# Patient Record
Sex: Female | Born: 1995 | Race: Black or African American | Hispanic: No | Marital: Single | Smoking: Former smoker
Health system: Southern US, Community
[De-identification: ages and names within clinical notes are randomized; demographics above are authoritative.]

## PROBLEM LIST (undated history)

## (undated) DIAGNOSIS — F419 Anxiety disorder, unspecified: Secondary | ICD-10-CM

## (undated) DIAGNOSIS — Z8719 Personal history of other diseases of the digestive system: Secondary | ICD-10-CM

## (undated) DIAGNOSIS — K529 Noninfective gastroenteritis and colitis, unspecified: Secondary | ICD-10-CM

## (undated) DIAGNOSIS — Z9889 Other specified postprocedural states: Secondary | ICD-10-CM

## (undated) HISTORY — PX: HERNIA REPAIR: SHX51

---
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
## 2016-04-22 ENCOUNTER — Emergency Department (HOSPITAL_BASED_OUTPATIENT_CLINIC_OR_DEPARTMENT_OTHER): Payer: Managed Care, Other (non HMO)

## 2016-04-22 ENCOUNTER — Encounter (HOSPITAL_BASED_OUTPATIENT_CLINIC_OR_DEPARTMENT_OTHER): Payer: Self-pay | Admitting: Emergency Medicine

## 2016-04-22 ENCOUNTER — Emergency Department (HOSPITAL_BASED_OUTPATIENT_CLINIC_OR_DEPARTMENT_OTHER)
Admission: EM | Admit: 2016-04-22 | Discharge: 2016-04-22 | Disposition: A | Discharge status: Home / Self Care | Payer: Managed Care, Other (non HMO) | Attending: Emergency Medicine | Admitting: Emergency Medicine

## 2016-04-22 DIAGNOSIS — R112 Nausea with vomiting, unspecified: Secondary | ICD-10-CM | POA: Diagnosis present

## 2016-04-22 DIAGNOSIS — R1013 Epigastric pain: Secondary | ICD-10-CM | POA: Diagnosis not present

## 2016-04-22 DIAGNOSIS — R197 Diarrhea, unspecified: Secondary | ICD-10-CM | POA: Diagnosis not present

## 2016-04-22 LAB — COMPREHENSIVE METABOLIC PANEL
ALBUMIN: 4.6 g/dL (ref 3.5–5.0)
ALT: 18 U/L (ref 14–54)
ANION GAP: 13 (ref 5–15)
AST: 34 U/L (ref 15–41)
Alkaline Phosphatase: 62 U/L (ref 38–126)
BILIRUBIN TOTAL: 1.3 mg/dL — AB (ref 0.3–1.2)
BUN: 7 mg/dL (ref 6–20)
CO2: 17 mmol/L — AB (ref 22–32)
Calcium: 10 mg/dL (ref 8.9–10.3)
Chloride: 108 mmol/L (ref 101–111)
Creatinine, Ser: 0.78 mg/dL (ref 0.44–1.00)
GFR calc Af Amer: >60 mL/min (ref >60)
GFR calc non Af Amer: >60 mL/min (ref >60)
GLUCOSE: 133 mg/dL — AB (ref 65–99)
Potassium: 3.2 mmol/L — ABNORMAL LOW (ref 3.5–5.1)
SODIUM: 138 mmol/L (ref 135–145)
TOTAL PROTEIN: 8.1 g/dL (ref 6.5–8.1)

## 2016-04-22 LAB — CBC WITH DIFFERENTIAL/PLATELET
BASOS ABS: 0 10*3/uL (ref 0.0–0.1)
BASOS PCT: 0 %
EOS ABS: 0 10*3/uL (ref 0.0–0.7)
Eosinophils Relative: 0 %
HEMATOCRIT: 43.5 % (ref 36.0–46.0)
Hemoglobin: 15.1 g/dL — ABNORMAL HIGH (ref 12.0–15.0)
Lymphocytes Relative: 16 %
Lymphs Abs: 1.5 10*3/uL (ref 0.7–4.0)
MCH: 29 pg (ref 26.0–34.0)
MCHC: 34.7 g/dL (ref 30.0–36.0)
MCV: 83.5 fL (ref 78.0–100.0)
Monocytes Absolute: 0.5 10*3/uL (ref 0.1–1.0)
Monocytes Relative: 6 %
NEUTROS PCT: 78 %
Neutro Abs: 7.4 10*3/uL (ref 1.7–7.7)
Platelets: 288 10*3/uL (ref 150–400)
RBC: 5.21 MIL/uL — ABNORMAL HIGH (ref 3.87–5.11)
RDW: 11.9 % (ref 11.5–15.5)
WBC: 9.5 10*3/uL (ref 4.0–10.5)

## 2016-04-22 LAB — URINALYSIS, ROUTINE W REFLEX MICROSCOPIC
Bilirubin Urine: NEGATIVE
GLUCOSE, UA: 250 mg/dL — AB
Hgb urine dipstick: NEGATIVE
LEUKOCYTES UA: NEGATIVE
NITRITE: NEGATIVE
PH: 8.5 — AB (ref 5.0–8.0)
Protein, ur: NEGATIVE mg/dL
SPECIFIC GRAVITY, URINE: 1.018 (ref 1.005–1.030)

## 2016-04-22 LAB — LIPASE, BLOOD: Lipase: 21 U/L (ref 11–51)

## 2016-04-22 LAB — PREGNANCY, URINE: Preg Test, Ur: NEGATIVE

## 2016-04-22 MED ORDER — SODIUM CHLORIDE 0.9 % IV BOLUS (SEPSIS)
1000.0000 mL | Freq: Once | INTRAVENOUS | Status: AC
  Administered 2016-04-22: 1000 mL via INTRAVENOUS

## 2016-04-22 MED ORDER — ONDANSETRON HCL 4 MG/2ML IJ SOLN
4.0000 mg | Freq: Once | INTRAMUSCULAR | Status: AC
  Administered 2016-04-22: 4 mg via INTRAVENOUS
  Filled 2016-04-22: qty 2

## 2016-04-22 MED ORDER — PROMETHAZINE HCL 25 MG PO TABS: 25.0000 mg | ORAL_TABLET | Freq: Four times a day (QID) | ORAL | 0 refills | Status: DC | PRN

## 2016-04-22 MED ORDER — HALOPERIDOL LACTATE 5 MG/ML IJ SOLN
2.0000 mg | Freq: Once | INTRAMUSCULAR | Status: AC
  Administered 2016-04-22: 2 mg via INTRAVENOUS
  Filled 2016-04-22: qty 1

## 2016-04-22 MED ORDER — PANTOPRAZOLE SODIUM 40 MG IV SOLR
40.0000 mg | Freq: Once | INTRAVENOUS | Status: AC
  Administered 2016-04-22: 40 mg via INTRAVENOUS
  Filled 2016-04-22: qty 40

## 2016-04-22 MED ORDER — DEXTROSE 5 % IV BOLUS
1000.0000 mL | Freq: Once | INTRAVENOUS | Status: AC
  Administered 2016-04-22: 1000 mL via INTRAVENOUS

## 2016-04-22 MED ORDER — LORAZEPAM 2 MG/ML IJ SOLN
1.0000 mg | Freq: Once | INTRAMUSCULAR | Status: AC
  Administered 2016-04-22: 1 mg via INTRAVENOUS
  Filled 2016-04-22: qty 1

## 2016-04-22 MED ORDER — PROMETHAZINE HCL 25 MG RE SUPP: 25.0000 mg | Freq: Four times a day (QID) | RECTAL | 0 refills | Status: DC | PRN

## 2016-04-22 MED ORDER — FENTANYL CITRATE (PF) 100 MCG/2ML IJ SOLN
50.0000 ug | Freq: Once | INTRAMUSCULAR | Status: AC
  Administered 2016-04-22: 50 ug via INTRAVENOUS
  Filled 2016-04-22: qty 2

## 2016-04-22 MED FILL — PROMETHAZINE 25 MG TABLET: 25 | 7 days supply | Qty: 30 | Fill #0

## 2016-04-22 MED FILL — PHENADOZ 25 MG SUPP: 25 | 3 days supply | Qty: 12 | Fill #0

## 2016-04-22 NOTE — ED Notes (Signed)
Tol PO fluids

## 2016-04-22 NOTE — ED Notes (Signed)
Capzasin-HP cream applied to abdomen per order Dr. Anitra LauthPlunkett.

## 2016-04-22 NOTE — ED Notes (Signed)
Family to nsg station stating pt is still c/o epigastric pain. Dr. Read DriversMolpus present and aware.

## 2016-04-22 NOTE — ED Provider Notes (Addendum)
Urine consistent with dehydration with greater than 80 ketones and mild hypokalemia suggestive of vomiting. X-ray without significant signs of constipation or obstruction. Patient feeling better and tolerating by mouth's. She was discharged home with referral to left lower GI as family requested. She was also given Phenergan to try at home with nausea and vomiting returned.   9:09 AM Right before d/c pt began vomiting and abd cramping again and given haldol.  Gwyneth SproutWhitney Bonnie Overdorf, MD 04/22/16 [id number]    Gwyneth SproutWhitney Ashiyah Pavlak, MD 04/22/16 [phone]

## 2016-04-22 NOTE — ED Triage Notes (Signed)
Pt with vomiting x several hours.

## 2016-04-22 NOTE — ED Triage Notes (Signed)
Pt hyperventilating, yelling out, and not cooperative.

## 2016-04-22 NOTE — ED Provider Notes (Signed)
[facility] Provider Note: Lowella Dell, MD, FACEP  CSN: [id number] MRN: [id number] ARRIVAL: 04/22/16 at 0601 ROOM: MHOTF/OTF   CHIEF COMPLAINT  Vomiting   HISTORY OF PRESENT ILLNESS  Lindsey Sherman is a 21 y.o. female who awoke about 4:30 this morning with severe epigastric pain, nausea and vomiting. She also has a sensation that she may have diarrhea but has not moved her bowels yet. She attempted to take ODT Zofran prior to arrival but vomited this back up. She arrives agitated and hyperventilating. Epigastric pain is worse with movement or palpation. She is having difficulty finding a comfortable position. It is not known if she has had a fever. She has a Mirena implant and has irregular periods.   Past Medical History:  Diagnosis Date  . Colitis   . H/O hernia repair 2008   done at [facility]    Past Surgical History:  Procedure Laterality Date  . h/o hernia repair  2008  . HERNIA REPAIR      Family History  Problem Relation Age of Onset  . Hypertension Mother   . Diabetes Maternal Grandmother   . Hypertension Maternal Grandmother   . Osteoarthritis Maternal Grandmother   . Heart failure Maternal Grandmother   . Stroke Maternal Grandmother     Social History  Substance Use Topics  . Smoking status: Never Smoker  . Smokeless tobacco: Never Used  . Alcohol use No    Prior to Admission medications   Medication Sig Start Date End Date Taking? Authorizing Provider  etonogestrel (NEXPLANON) 68 MG IMPL implant 1 each by Subdermal route once.   Yes Historical Provider, MD  ondansetron (ZOFRAN-ODT) 4 MG disintegrating tablet Take 4 mg by mouth every 8 (eight) hours as needed for nausea or vomiting.   Yes Historical Provider, MD  promethazine (PHENERGAN) 25 MG suppository Place 1 suppository (25 mg total) rectally every 6 (six) hours as needed for nausea or vomiting. 04/22/16   Gwyneth Sprout, MD  promethazine (PHENERGAN) 25 MG tablet Take 1 tablet (25 mg  total) by mouth every 6 (six) hours as needed for nausea or vomiting. 04/22/16   Gwyneth Sprout, MD    Allergies Amoxicillin and Penicillins   REVIEW OF SYSTEMS  Negative except as noted here or in the History of Present Illness.   PHYSICAL EXAMINATION  Initial Vital Signs Blood pressure 148/97, pulse (!) 123, resp. rate (!) 32, SpO2 100 %.  Examination General: Well-developed, well-nourished female; appearance consistent with age of record HENT: normocephalic; atraumatic Eyes: pupils equal, round and reactive to light; extraocular muscles intact Neck: supple Heart: regular rate and rhythm; tachycardia Lungs: clear to auscultation bilaterally; hyperventilating Abdomen: soft; nondistended; epigastric tenderness; bowel sounds present Extremities: No deformity; full range of motion; pulses normal Neurologic: Awake, alert; motor function intact in all extremities and symmetric; no facial droop Skin: Warm and dry Psychiatric: Agitated   RESULTS  Summary of this visit's results, reviewed by myself:   EKG Interpretation  Date/Time:    Ventricular Rate:    PR Interval:    QRS Duration:   QT Interval:    QTC Calculation:   R Axis:     Text Interpretation:        Laboratory Studies: Results for orders placed or performed during the hospital encounter of 04/22/16 (from the past 24 hour(s))  CBC with Differential     Status: Abnormal   Collection Time: 04/22/16  6:00 AM  Result Value Ref Range   WBC 9.5 4.0 -  10.5 K/uL   RBC 5.21 (H) 3.87 - 5.11 MIL/uL   Hemoglobin 15.1 (H) 12.0 - 15.0 g/dL   HCT 29.543.5 62.136.0 - 30.846.0 %   MCV 83.5 78.0 - 100.0 fL   MCH 29.0 26.0 - 34.0 pg   MCHC 34.7 30.0 - 36.0 g/dL   RDW 65.711.9 84.611.5 - 96.215.5 %   Platelets 288 150 - 400 K/uL   Neutrophils Relative % 78 %   Neutro Abs 7.4 1.7 - 7.7 K/uL   Lymphocytes Relative 16 %   Lymphs Abs 1.5 0.7 - 4.0 K/uL   Monocytes Relative 6 %   Monocytes Absolute 0.5 0.1 - 1.0 K/uL   Eosinophils Relative 0 %    Eosinophils Absolute 0.0 0.0 - 0.7 K/uL   Basophils Relative 0 %   Basophils Absolute 0.0 0.0 - 0.1 K/uL  Comprehensive metabolic panel     Status: Abnormal   Collection Time: 04/22/16  6:00 AM  Result Value Ref Range   Sodium 138 135 - 145 mmol/L   Potassium 3.2 (L) 3.5 - 5.1 mmol/L   Chloride 108 101 - 111 mmol/L   CO2 17 (L) 22 - 32 mmol/L   Glucose, Bld 133 (H) 65 - 99 mg/dL   BUN 7 6 - 20 mg/dL   Creatinine, Ser 9.520.78 0.44 - 1.00 mg/dL   Calcium 84.110.0 8.9 - 32.410.3 mg/dL   Total Protein 8.1 6.5 - 8.1 g/dL   Albumin 4.6 3.5 - 5.0 g/dL   AST 34 15 - 41 U/L   ALT 18 14 - 54 U/L   Alkaline Phosphatase 62 38 - 126 U/L   Total Bilirubin 1.3 (H) 0.3 - 1.2 mg/dL   GFR calc non Af Amer >60 >60 mL/min   GFR calc Af Amer >60 >60 mL/min   Anion gap 13 5 - 15  Lipase, blood     Status: None   Collection Time: 04/22/16  6:00 AM  Result Value Ref Range   Lipase 21 11 - 51 U/L  Urinalysis, Routine w reflex microscopic     Status: Abnormal   Collection Time: 04/22/16  7:30 AM  Result Value Ref Range   Color, Urine YELLOW YELLOW   APPearance CLOUDY (A) CLEAR   Specific Gravity, Urine 1.018 1.005 - 1.030   pH 8.5 (H) 5.0 - 8.0   Glucose, UA 250 (A) NEGATIVE mg/dL   Hgb urine dipstick NEGATIVE NEGATIVE   Bilirubin Urine NEGATIVE NEGATIVE   Ketones, ur >80 (A) NEGATIVE mg/dL   Protein, ur NEGATIVE NEGATIVE mg/dL   Nitrite NEGATIVE NEGATIVE   Leukocytes, UA NEGATIVE NEGATIVE  Pregnancy, urine     Status: None   Collection Time: 04/22/16  7:30 AM  Result Value Ref Range   Preg Test, Ur NEGATIVE NEGATIVE   Imaging Studies: Dg Abdomen 1 View  Result Date: 04/22/2016 CLINICAL DATA:  Abdominal pain and nausea vomiting since yesterday. EXAM: ABDOMEN - 1 VIEW COMPARISON:  Abdominal and pelvic CT scan of March 28, 2016 FINDINGS: The bowel gas pattern is within the limits of normal. There is no small or large bowel obstructive pattern. There are no abnormal soft tissue calcifications. The bony  structures are unremarkable. IMPRESSION: No acute intra- abdominal abnormality is observed. Electronically Signed   By: David  SwazilandJordan M.D.   On: 04/22/2016 08:19    ED COURSE  Nursing notes and initial vitals signs, including pulse oximetry, reviewed.  Vitals:   04/22/16 [id number] 04/22/16 0723 04/22/16 0954 04/22/16 1135  BP: 136/97  128/93 (!) 140/106 144/96  Pulse: 92 85 110 71  Resp: 14 14 22 18   SpO2: 98% 100% 100% 100%    PROCEDURES    ED DIAGNOSES     ICD-9-CM ICD-10-CM   1. Intractable vomiting with nausea, unspecified vomiting type 536.2 R11.2        Paula Libra, MD 04/22/16 2243

## 2016-04-30 ENCOUNTER — Other Ambulatory Visit: Payer: Self-pay | Admitting: *Deleted

## 2016-04-30 NOTE — Patient Outreach (Addendum)
[facility] Care Management  04/30/2016  Breanna Grefe 1995/08/31 [id number]   Subjective: Telephone phone call to patient's home number, spoke with Ms. Raul Dellston, states Arneta Wassmer not at this number, and can be reached at [phone].   Telephone call to patient's mobile number, no answer, left HIPAA compliant voicemail message, and requested call back.  Objective: Per [employer]elle [employer] and chart review, patient hospitalized 04/25/16 - 04/26/16 for dehydration.   Patient also had ED visit on 04/22/16 for intractable vomiting with nausea.     Assessment: Received [employer] Transition of care referral on 04/30/16.   Transition of care follow up pending patient contact.   Plan: RNCM will call patient for 2nd telephone outreach attempt, transition of care follow up, within 10 business days, if no return call.   Quanta Robertshaw H. Gardiner Barefootooper RN, BSN, CCM [facility] Care Management [facility] Telephonic CM Phone: [phone] Fax: [phone]

## 2016-05-04 ENCOUNTER — Encounter: Payer: Self-pay | Admitting: *Deleted

## 2016-05-04 ENCOUNTER — Ambulatory Visit: Payer: Self-pay | Admitting: *Deleted

## 2016-05-04 ENCOUNTER — Other Ambulatory Visit: Payer: Self-pay | Admitting: *Deleted

## 2016-05-04 NOTE — Patient Outreach (Signed)
[facility] Care Management  05/04/2016  Lindsey Sherman Jul 14, 1995 [id number]   Subjective: Telephone phone call to patient's home number, spoke with step-mother, states Lindsey Sherman not at this number, and can be reached at [phone].   Telephone call to patient's mobile number, spoke with patient, and HIPAA verified.   Patient states she has moved and her new address is [address], [place] [address].  States she would also like the following information updated in chart: mobile number [phone] changed to home number, remove [phone] as home number, remove [name] as emergency contact, and add mother Lindsey Sherman as emergency contact [phone].   D[facility]ed [facility] [employer] Transition of care follow up, patient voices understanding, and is in agreement to complete follow up. Patient states she is doing well, feeling much better, no nausea, able to tolerate diet, and had follow up appointment with primary MD ( Dr. Georgianne FickAjith Ramachandran) on 05/03/16.   Patient states she does not have any transition of care, care coordination, disease management, disease monitoring, transportation, community resource, or pharmacy needs at this time.  States she is very appreciative of follow up call, and is in agreement to receive [facility] information.  Objective: Per Darnelle Goingigna i[employer]aborate and chart review, patient hospitalized 04/25/16 - 04/26/16 for dehydration.   Patient also had ED visit on 04/22/16 for intractable vomiting with nausea.     Assessment: Received [employer] Transition of care referral on 04/30/16. Transition of care follow up completed, no care management needs, and will proceed with case closure.   Plan: RNCM will send patient successful outreach letter, [facility] pamphlet, and magnet. RNCM will send case closure due to follow up completed / no care management needs request to Iverson AlaminLaura Greeson at [facility]. RNCM will send request to Iverson AlaminLaura  Greeson at [facility] Care Management to update the following information in patient's chart: new address is [address], [place] [address], mobile number [phone] changed to home number, remove [phone] as home number, remove [name] as emergency contact, and add mother Lindsey Sherman as emergency contact [phone].   Also change primary MD to Dr. Georgianne FickAjith Ramachandran.     Jaesean Litzau H. Gardiner Barefootooper RN, BSN, CCM [facility] Care Management [facility] Telephonic CM Phone: [phone] Fax: [phone]

## 2016-08-17 ENCOUNTER — Encounter (HOSPITAL_COMMUNITY): Payer: Self-pay

## 2016-08-17 ENCOUNTER — Emergency Department (HOSPITAL_COMMUNITY)
Admission: EM | Admit: 2016-08-17 | Discharge: 2016-08-18 | Disposition: A | Discharge status: Home / Self Care | Payer: Managed Care, Other (non HMO) | Attending: Emergency Medicine | Admitting: Emergency Medicine

## 2016-08-17 ENCOUNTER — Emergency Department (HOSPITAL_COMMUNITY)
Admission: EM | Admit: 2016-08-17 | Discharge: 2016-08-17 | Discharge status: Against Medical Advice | Payer: Managed Care, Other (non HMO) | Source: Home / Self Care

## 2016-08-17 ENCOUNTER — Encounter (HOSPITAL_COMMUNITY): Payer: Self-pay | Admitting: Emergency Medicine

## 2016-08-17 DIAGNOSIS — R1013 Epigastric pain: Secondary | ICD-10-CM | POA: Insufficient documentation

## 2016-08-17 DIAGNOSIS — R197 Diarrhea, unspecified: Secondary | ICD-10-CM

## 2016-08-17 DIAGNOSIS — Z5321 Procedure and treatment not carried out due to patient leaving prior to being seen by health care provider: Secondary | ICD-10-CM

## 2016-08-17 DIAGNOSIS — R112 Nausea with vomiting, unspecified: Secondary | ICD-10-CM

## 2016-08-17 DIAGNOSIS — Z79899 Other long term (current) drug therapy: Secondary | ICD-10-CM | POA: Insufficient documentation

## 2016-08-17 DIAGNOSIS — R1010 Upper abdominal pain, unspecified: Secondary | ICD-10-CM

## 2016-08-17 LAB — URINALYSIS, ROUTINE W REFLEX MICROSCOPIC
Glucose, UA: NEGATIVE mg/dL
HGB URINE DIPSTICK: NEGATIVE
Ketones, ur: 80 mg/dL — AB
NITRITE: NEGATIVE
Protein, ur: 100 mg/dL — AB
SPECIFIC GRAVITY, URINE: 1.033 — AB (ref 1.005–1.030)
pH: 5 (ref 5.0–8.0)

## 2016-08-17 LAB — COMPREHENSIVE METABOLIC PANEL
ALBUMIN: 4.5 g/dL (ref 3.5–5.0)
ALBUMIN: 4.3 g/dL (ref 3.5–5.0)
ALK PHOS: 77 U/L (ref 38–126)
ALK PHOS: 70 U/L (ref 38–126)
ALT: 18 U/L (ref 14–54)
ALT: 17 U/L (ref 14–54)
ANION GAP: 16 — AB (ref 5–15)
AST: 31 U/L (ref 15–41)
AST: 25 U/L (ref 15–41)
Anion gap: 10 (ref 5–15)
BILIRUBIN TOTAL: 1.7 mg/dL — AB (ref 0.3–1.2)
BUN: 10 mg/dL (ref 6–20)
BUN: 12 mg/dL (ref 6–20)
CALCIUM: 10 mg/dL (ref 8.9–10.3)
CO2: 15 mmol/L — AB (ref 22–32)
CO2: 19 mmol/L — ABNORMAL LOW (ref 22–32)
Calcium: 9.3 mg/dL (ref 8.9–10.3)
Chloride: 104 mmol/L (ref 101–111)
Chloride: 107 mmol/L (ref 101–111)
Creatinine, Ser: 0.89 mg/dL (ref 0.44–1.00)
Creatinine, Ser: 0.84 mg/dL (ref 0.44–1.00)
GFR calc Af Amer: >60 mL/min (ref >60)
GFR calc Af Amer: >60 mL/min (ref >60)
GFR calc non Af Amer: >60 mL/min (ref >60)
GFR calc non Af Amer: >60 mL/min (ref >60)
GLUCOSE: 98 mg/dL (ref 65–99)
GLUCOSE: 112 mg/dL — AB (ref 65–99)
Potassium: 3.5 mmol/L (ref 3.5–5.1)
Potassium: 3.2 mmol/L — ABNORMAL LOW (ref 3.5–5.1)
SODIUM: 135 mmol/L (ref 135–145)
Sodium: 136 mmol/L (ref 135–145)
TOTAL PROTEIN: 7.6 g/dL (ref 6.5–8.1)
Total Bilirubin: 1.6 mg/dL — ABNORMAL HIGH (ref 0.3–1.2)
Total Protein: 7.8 g/dL (ref 6.5–8.1)

## 2016-08-17 LAB — CBC
HCT: 45.9 % (ref 36.0–46.0)
HCT: 42.7 % (ref 36.0–46.0)
HEMOGLOBIN: 16.2 g/dL — AB (ref 12.0–15.0)
Hemoglobin: 15.1 g/dL — ABNORMAL HIGH (ref 12.0–15.0)
MCH: 29.6 pg (ref 26.0–34.0)
MCH: 29.4 pg (ref 26.0–34.0)
MCHC: 35.3 g/dL (ref 30.0–36.0)
MCHC: 35.4 g/dL (ref 30.0–36.0)
MCV: 83.8 fL (ref 78.0–100.0)
MCV: 83.1 fL (ref 78.0–100.0)
PLATELETS: 264 10*3/uL (ref 150–400)
Platelets: 284 10*3/uL (ref 150–400)
RBC: 5.48 MIL/uL — ABNORMAL HIGH (ref 3.87–5.11)
RBC: 5.14 MIL/uL — ABNORMAL HIGH (ref 3.87–5.11)
RDW: 12.7 % (ref 11.5–15.5)
RDW: 12.8 % (ref 11.5–15.5)
WBC: 10.7 10*3/uL — ABNORMAL HIGH (ref 4.0–10.5)
WBC: 10.1 10*3/uL (ref 4.0–10.5)

## 2016-08-17 LAB — I-STAT BETA HCG BLOOD, ED (MC, WL, AP ONLY)

## 2016-08-17 LAB — LIPASE, BLOOD
Lipase: 22 U/L (ref 11–51)
Lipase: 21 U/L (ref 11–51)

## 2016-08-17 MED ORDER — SODIUM CHLORIDE 0.9 % IV BOLUS (SEPSIS)
1000.0000 mL | Freq: Once | INTRAVENOUS | Status: AC
  Administered 2016-08-17: 1000 mL via INTRAVENOUS

## 2016-08-17 MED ORDER — ONDANSETRON 4 MG PO TBDP
ORAL_TABLET | ORAL | Status: AC
  Filled 2016-08-17: qty 1

## 2016-08-17 MED ORDER — MORPHINE SULFATE (PF) 4 MG/ML IV SOLN
4.0000 mg | Freq: Once | INTRAVENOUS | Status: AC
  Administered 2016-08-17: 4 mg via INTRAVENOUS
  Filled 2016-08-17: qty 1

## 2016-08-17 MED ORDER — ONDANSETRON HCL 4 MG/2ML IJ SOLN
4.0000 mg | Freq: Once | INTRAMUSCULAR | Status: AC
Start: 2016-08-17 — End: 2016-08-17
  Administered 2016-08-17: 4 mg via INTRAVENOUS
  Filled 2016-08-17: qty 2

## 2016-08-17 MED ORDER — ONDANSETRON 4 MG PO TBDP
4.0000 mg | ORAL_TABLET | Freq: Once | ORAL | Status: AC | PRN
  Administered 2016-08-17: 4 mg via ORAL

## 2016-08-17 NOTE — ED Triage Notes (Signed)
Pt is c/o upper abd pain that started yesterday  Pt states she was seen by her PCP this morning but the medicine he gave her has not helped

## 2016-08-17 NOTE — ED Triage Notes (Signed)
Pt is actively vomiting in triage 

## 2016-08-17 NOTE — ED Triage Notes (Signed)
Per Pt, Pt is coming from home with complaints of epigastric pain that started yesterday. Pt reports N/V/D that has gone along with the pain. Reports hx of Colitis. Denies urinary symptoms, GI Bleeding, or vaginal discharge.

## 2016-08-18 MED ORDER — PROMETHAZINE HCL 25 MG/ML IJ SOLN
25.0000 mg | Freq: Once | INTRAMUSCULAR | Status: AC
  Administered 2016-08-18: 25 mg via INTRAVENOUS
  Filled 2016-08-18: qty 1

## 2016-08-18 MED ORDER — PROMETHAZINE HCL 25 MG RE SUPP: 25.0000 mg | Freq: Four times a day (QID) | RECTAL | 0 refills | Status: DC | PRN

## 2016-08-18 MED ORDER — MORPHINE SULFATE (PF) 4 MG/ML IV SOLN
4.0000 mg | Freq: Once | INTRAVENOUS | Status: AC
  Administered 2016-08-18: 4 mg via INTRAVENOUS
  Filled 2016-08-18: qty 1

## 2016-08-18 MED ORDER — PROMETHAZINE HCL 25 MG PO TABS: 25.0000 mg | ORAL_TABLET | Freq: Four times a day (QID) | ORAL | 0 refills | Status: DC | PRN

## 2016-08-18 NOTE — ED Provider Notes (Signed)
[facility]-EMERGENCY DEPT Provider Note   CSN: [id number] Arrival date & time: 08/17/16  2024  By signing my name below, I, Rosana Fret, attest that this documentation has been prepared under the direction and in the presence of Dione Booze, MD. Electronically Signed: Rosana Fret, ED Scribe. 08/17/16. 11:32 PM.   History   Chief Complaint Chief Complaint  Patient presents with  . Abdominal Pain    HPI Lindsey Sherman is a 21 y.o. female.  The history is provided by the patient.  Lindsey Sherman is a 21 y.o. female with a PMHx of hernia repair and colitis, who presents to the Emergency Department complaining of 10/10, constant epigastric abdominal pain onset yesterday. Pt describes pain as non-radiating. Pt reports associated vomiting, diarrhea and diaphoresis. Pt states she has not been eating much. She notes her symptoms are similar to when she has been seen in the ED before. Per pt, she saw her PCP and was given OTZ. Pt reports mariajuana use. Pt denies fever, chills, urinary symptoms or any other complaints at this time.  Past Medical History:  Diagnosis Date  . Colitis   . H/O hernia repair 2008   done at [facility]    Patient Active Problem List   Diagnosis Date Noted  . Attention deficit hyperactivity disorder 04/26/2012  . 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 h/o hernia repair  2008  . HERNIA REPAIR      OB History    No data available       Home Medications    Prior to Admission medications   Medication Sig Start Date End Date Taking? Authorizing Provider  metoCLOPramide (REGLAN) 10 MG tablet Take 10 mg by mouth 4 (four) times daily -  before meals and at bedtime.   Yes [provider]  etonogestrel (NEXPLANON) 68 MG IMPL implant 1 each by Subdermal route once.    [provider]  promethazine (PHENERGAN) 25 MG suppository Place 1 suppository (25 mg  total) rectally every 6 (six) hours as needed for nausea or vomiting. 08/18/16   Dione Booze, MD  promethazine (PHENERGAN) 25 MG tablet Take 1 tablet (25 mg total) by mouth every 6 (six) hours as needed for nausea or vomiting. 08/18/16   Dione Booze, MD    Family History Family History  Problem Relation Age of Onset  . Hypertension Mother   . Diabetes Maternal Grandmother   . Hypertension Maternal Grandmother   . Osteoarthritis Maternal Grandmother   . Heart failure Maternal Grandmother   . Stroke Maternal Grandmother     Social History Social History  Substance Use Topics  . Smoking status: Never Smoker  . Smokeless tobacco: Never Used  . Alcohol use Yes     Comment: occ     Allergies   Amoxicillin and Penicillins   Review of Systems Review of Systems  All other systems reviewed and are negative.  Constitutional: She is oriented to person, place, and time. She appears well-developed and well-nourished.  Appears uncomfortable.   HENT:  Head: Normocephalic and atraumatic.  Eyes: EOM are normal. Pupils are equal, round, and reactive to light.  Neck: Normal range of motion. Neck supple. No JVD present.  Cardiovascular: Normal rate, regular rhythm and normal heart sounds.   No murmur heard. Pulmonary/Chest: Effort normal and breath sounds normal. She has no wheezes. She has no rales. She exhibits no tenderness.  Abdominal: Soft. She exhibits no distension and  no mass. There is tenderness. There is no rebound and no guarding.  Moderate epigastric tenderness. No rebound or guarding. Bowel sounds decreased.   Musculoskeletal: Normal range of motion. She exhibits no edema.  Lymphadenopathy:    She has no cervical adenopathy.  Neurological: She is alert and oriented to person, place, and time. No cranial nerve deficit. She exhibits normal muscle tone. Coordination normal.  Skin: Skin is warm and dry. No rash noted.  Psychiatric: She has a normal mood and affect. Her behavior  is normal. Judgment and thought content normal.  Nursing note and vitals reviewed.  Physical Exam Updated Vital Signs BP 110/77 (BP Location: Right Arm)   Pulse (!) 51   Temp 99.6 F (37.6 C) (Oral)   Resp 12   Ht 5\' 1"  (1.549 m)   Wt 62.2 kg (137 lb 3.2 oz)   SpO2 100%   BMI 25.92 kg/m   Physical Exam  Nursing note and vitals reviewed.    ED Treatments / Results  DIAGNOSTIC STUDIES: Oxygen Saturation is 100% on RA, normal by my interpretation.   COORDINATION OF CARE: 11:28 PM-Discussed next steps with pt including IV fluids and review of labs. Pt verbalized understanding and is agreeable with the plan.   Labs (all labs ordered are listed, but only abnormal results are displayed) Labs Reviewed  COMPREHENSIVE METABOLIC PANEL - Abnormal; Notable for the following:       Result Value   Potassium 3.2 (*)    CO2 19 (*)    Glucose, Bld 112 (*)    Total Bilirubin 1.7 (*)    All other components within normal limits  CBC - Abnormal; Notable for the following:    RBC 5.14 (*)    Hemoglobin 15.1 (*)    All other components within normal limits  URINALYSIS, ROUTINE W REFLEX MICROSCOPIC - Abnormal; Notable for the following:    Color, Urine AMBER (*)    APPearance CLOUDY (*)    Specific Gravity, Urine 1.033 (*)    Bilirubin Urine SMALL (*)    Ketones, ur 80 (*)    Protein, ur 100 (*)    Leukocytes, UA TRACE (*)    Bacteria, UA MANY (*)    Squamous Epithelial / LPF 6-30 (*)    All other components within normal limits  LIPASE, BLOOD  I-STAT BETA HCG BLOOD, ED ([facility], [facility], [facility] ONLY)    Procedures Procedures (including critical care time)  Medications Ordered in ED Medications  ondansetron (ZOFRAN) injection 4 mg (4 mg Intravenous Given 08/17/16 2351)  sodium chloride 0.9 % bolus 1,000 mL (0 mLs Intravenous Stopped 08/18/16 0100)  morphine 4 MG/ML injection 4 mg (4 mg Intravenous Given 08/17/16 2351)  morphine 4 MG/ML injection 4 mg (4 mg Intravenous Given 08/18/16 0315)    promethazine (PHENERGAN) injection 25 mg (25 mg Intravenous Given 08/18/16 0315)     Initial Impression / Assessment and Plan / ED Course  I have reviewed the triage vital signs and the nursing notes.  Pertinent labs & imaging results that were available during my care of the patient were reviewed by me and considered in my medical decision making (see chart for details).  Abdominal pain, vomiting, diarrhea. 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 She is treated with IV fluids, metoclopramide, morphine with little improvement. She is given further fluids, morphine, and promethazine with significant improvement. She is discharged with prescriptions for promethazine tablet suppository. Follow-up with  PCP.  Final Clinical Impressions(s) / ED Diagnoses   Final diagnoses:  Nausea vomiting and diarrhea  Upper abdominal pain    New Prescriptions Current Discharge Medication List     I personally performed the services described in this documentation, which was scribed in my presence. The recorded information has been reviewed and is accurate.      Dione BoozeGlick, Santiel Topper, MD 08/18/16 [phone]

## 2016-12-09 ENCOUNTER — Encounter (HOSPITAL_COMMUNITY): Payer: Self-pay | Admitting: *Deleted

## 2016-12-09 ENCOUNTER — Emergency Department (HOSPITAL_COMMUNITY)
Admission: EM | Admit: 2016-12-09 | Discharge: 2016-12-09 | Disposition: A | Discharge status: Home / Self Care | Payer: Managed Care, Other (non HMO) | Attending: Emergency Medicine | Admitting: Emergency Medicine

## 2016-12-09 ENCOUNTER — Emergency Department (HOSPITAL_COMMUNITY): Payer: Managed Care, Other (non HMO)

## 2016-12-09 DIAGNOSIS — R112 Nausea with vomiting, unspecified: Secondary | ICD-10-CM | POA: Diagnosis present

## 2016-12-09 DIAGNOSIS — Z79899 Other long term (current) drug therapy: Secondary | ICD-10-CM | POA: Diagnosis not present

## 2016-12-09 DIAGNOSIS — N390 Urinary tract infection, site not specified: Secondary | ICD-10-CM | POA: Diagnosis not present

## 2016-12-09 DIAGNOSIS — R1010 Upper abdominal pain, unspecified: Secondary | ICD-10-CM

## 2016-12-09 LAB — CBC
HCT: 47.1 % — ABNORMAL HIGH (ref 36.0–46.0)
HEMOGLOBIN: 16.6 g/dL — AB (ref 12.0–15.0)
MCH: 30.3 pg (ref 26.0–34.0)
MCHC: 35.2 g/dL (ref 30.0–36.0)
MCV: 86.1 fL (ref 78.0–100.0)
PLATELETS: 297 10*3/uL (ref 150–400)
RBC: 5.47 MIL/uL — AB (ref 3.87–5.11)
RDW: 13 % (ref 11.5–15.5)
WBC: 14.9 10*3/uL — AB (ref 4.0–10.5)

## 2016-12-09 LAB — COMPREHENSIVE METABOLIC PANEL
ALT: 22 U/L (ref 14–54)
AST: 35 U/L (ref 15–41)
Albumin: 5.1 g/dL — ABNORMAL HIGH (ref 3.5–5.0)
Alkaline Phosphatase: 66 U/L (ref 38–126)
Anion gap: 16 — ABNORMAL HIGH (ref 5–15)
BILIRUBIN TOTAL: 2.6 mg/dL — AB (ref 0.3–1.2)
BUN: 11 mg/dL (ref 6–20)
CO2: 19 mmol/L — ABNORMAL LOW (ref 22–32)
CREATININE: 0.87 mg/dL (ref 0.44–1.00)
Calcium: 10.3 mg/dL (ref 8.9–10.3)
Chloride: 103 mmol/L (ref 101–111)
GFR calc Af Amer: >60 mL/min (ref >60)
Glucose, Bld: 88 mg/dL (ref 65–99)
Potassium: 3.3 mmol/L — ABNORMAL LOW (ref 3.5–5.1)
Sodium: 138 mmol/L (ref 135–145)
TOTAL PROTEIN: 8.8 g/dL — AB (ref 6.5–8.1)

## 2016-12-09 LAB — RAPID URINE DRUG SCREEN, HOSP PERFORMED
Amphetamines: NOT DETECTED
Barbiturates: NOT DETECTED
Benzodiazepines: POSITIVE — AB
COCAINE: NOT DETECTED
OPIATES: POSITIVE — AB
TETRAHYDROCANNABINOL: POSITIVE — AB

## 2016-12-09 LAB — URINALYSIS, ROUTINE W REFLEX MICROSCOPIC
BILIRUBIN URINE: NEGATIVE
Glucose, UA: NEGATIVE mg/dL
Ketones, ur: 20 mg/dL — AB
Nitrite: POSITIVE — AB
PROTEIN: 30 mg/dL — AB
SPECIFIC GRAVITY, URINE: 1.021 (ref 1.005–1.030)
pH: 6 (ref 5.0–8.0)

## 2016-12-09 LAB — PREGNANCY, URINE: Preg Test, Ur: NEGATIVE

## 2016-12-09 LAB — LIPASE, BLOOD: LIPASE: 24 U/L (ref 11–51)

## 2016-12-09 MED ORDER — SODIUM CHLORIDE 0.9 % IV BOLUS (SEPSIS)
1000.0000 mL | Freq: Once | INTRAVENOUS | Status: AC
  Administered 2016-12-09: 1000 mL via INTRAVENOUS

## 2016-12-09 MED ORDER — NITROFURANTOIN MONOHYD MACRO 100 MG PO CAPS: 100.0000 mg | ORAL_CAPSULE | Freq: Two times a day (BID) | ORAL | 0 refills | Status: AC

## 2016-12-09 MED ORDER — PROMETHAZINE HCL 25 MG PO TABS: 25.0000 mg | ORAL_TABLET | Freq: Three times a day (TID) | ORAL | 0 refills | Status: DC | PRN

## 2016-12-09 MED ORDER — ONDANSETRON HCL 4 MG/2ML IJ SOLN
4.0000 mg | Freq: Once | INTRAMUSCULAR | Status: AC
  Administered 2016-12-09: 4 mg via INTRAVENOUS
  Filled 2016-12-09: qty 2

## 2016-12-09 MED ORDER — POTASSIUM CHLORIDE ER 10 MEQ PO TBCR: 10.0000 meq | EXTENDED_RELEASE_TABLET | Freq: Every day | ORAL | 0 refills | Status: DC

## 2016-12-09 MED ORDER — PROMETHAZINE HCL 25 MG/ML IJ SOLN
25.0000 mg | Freq: Once | INTRAMUSCULAR | Status: AC
  Administered 2016-12-09: 25 mg via INTRAVENOUS
  Filled 2016-12-09: qty 1

## 2016-12-09 MED ORDER — MORPHINE SULFATE (PF) 4 MG/ML IV SOLN
4.0000 mg | Freq: Once | INTRAVENOUS | Status: AC
  Administered 2016-12-09: 4 mg via INTRAVENOUS
  Filled 2016-12-09: qty 1

## 2016-12-09 NOTE — ED Notes (Signed)
Patient given apple juice

## 2016-12-09 NOTE — Discharge Instructions (Signed)
It is very important that you stop using marijuana, as this can cause continue nausea and vomiting.  Take macrobid as prescribed.  Take potassium until finished. Use phenergan as needed for nausea or vomiting.  Follow up with your primary care doctor if symptoms persist. Return to the ER if you develop worsening pain, pain moves to the right lower area, you develop fevers, or you have any new or worsening symptoms.

## 2016-12-09 NOTE — ED Triage Notes (Signed)
Per EMS, pt from doctor's office complains of abdominal pain and vomiting since yesterday. Pt states she has not had bowel movement in a week. Pt has hx of colitis.

## 2016-12-09 NOTE — ED Provider Notes (Signed)
[facility]-EMERGENCY DEPT Provider Note   CSN: [id number] Arrival date & time: 12/09/16  1650     History   Chief Complaint Chief Complaint  Patient presents with  . Abdominal Pain    HPI Lindsey Sherman is a 21 y.o. female presenting with abdominal pain, nausea, and vomiting.  Patient states she was eating dinner on Sunday when she had acute onset epigastric pain and vomiting. This resolved, and she had worsening return of symptoms on Tuesday. She reports epigastric and upper abdominal pain. It is described as cramping, it comes and goes. Nothing makes the pain better or worse. It is not associated with oral intake or with bowel movements. She denies fevers, chills, cough, chest pain, shortness of breath, lower abdominal pain, or urinary symptoms. Patient states she had a bowel movement yesterday after having an enema, but prior to enema, last bowel movement was 5 days prior. There was no blood in her stool. History of hernia repair at age 80, but no other abdominal surgeries. No sick contacts. Patient states she smokes cigarettes, drinks alcohol occasionally. She states she is no longer using marijuana, last used 1 wk ago. She denies other drug use.  LMP 2 months ago, she has irregular periods. Has nexplanon implant. Is sexually active. Does not want to be tested for UTI today.   Per chart review, patient has been evaluated multiple times for hyperemesis related to cannabinoid use.  HPI  Past Medical History:  Diagnosis Date  . Colitis   . H/O hernia repair 2008   done at [facility]    Patient Active Problem List   Diagnosis Date Noted  . Attention deficit hyperactivity disorder 04/26/2012  . 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 h/o hernia repair  2008  . HERNIA REPAIR      OB History    No data available       Home Medications    Prior to Admission medications   Medication Sig Start  Date End Date Taking? Authorizing Provider  etonogestrel (NEXPLANON) 68 MG IMPL implant 1 each by Subdermal route once.   Yes [provider]  metoCLOPramide (REGLAN) 10 MG tablet Take 10 mg by mouth every 6 (six) hours as needed for nausea or vomiting.    Yes [provider]  promethazine (PHENERGAN) 25 MG tablet Take 1 tablet (25 mg total) by mouth every 6 (six) hours as needed for nausea or vomiting. 08/18/16  Yes Dione Booze, MD  nitrofurantoin, macrocrystal-monohydrate, (MACROBID) 100 MG capsule Take 1 capsule (100 mg total) by mouth 2 (two) times daily. 12/09/16 12/14/16  Quintavius Niebuhr, PA-C  potassium chloride (K-DUR) 10 MEQ tablet Take 1 tablet (10 mEq total) by mouth daily. 12/09/16 12/16/16  Tekeisha Hakim, PA-C  promethazine (PHENERGAN) 25 MG suppository Place 1 suppository (25 mg total) rectally every 6 (six) hours as needed for nausea or vomiting. Patient not taking: Reported on 12/09/2016 08/18/16   Dione Booze, MD  promethazine (PHENERGAN) 25 MG tablet Take 1 tablet (25 mg total) by mouth every 8 (eight) hours as needed for nausea or vomiting. 12/09/16   Nel Stoneking, PA-C    Family History Family History  Problem Relation Age of Onset  . Hypertension Mother   . Diabetes Maternal Grandmother   . Hypertension Maternal Grandmother   . Osteoarthritis Maternal Grandmother   . Heart failure Maternal Grandmother   . Stroke Maternal Grandmother     Social History  Social History  Substance Use Topics  . Smoking status: Never Smoker  . Smokeless tobacco: Never Used  . Alcohol use Yes     Comment: occ     Allergies   Amoxicillin and Penicillins   Review of Systems Review of Systems  Constitutional: Negative for chills and fever.  HENT: Negative for congestion and sore throat.   Respiratory: Negative for cough, chest tightness and shortness of breath.   Cardiovascular: Negative for chest pain, palpitations and leg swelling.    Gastrointestinal: Positive for abdominal pain, constipation, nausea and vomiting.  Genitourinary: Negative for dysuria, frequency and hematuria.  Musculoskeletal: Negative for back pain.  Skin: Negative for wound.  Allergic/Immunologic: Negative for immunocompromised state.  Neurological: Negative for dizziness and headaches.  Hematological: Does not bruise/bleed easily.     Physical Exam Updated Vital Signs BP (!) 134/95 (BP Location: Right Arm)   Pulse 74   Temp 98 F (36.7 C) (Oral)   Resp 16   Ht 5' 1.5" (1.562 m)   Wt 62.1 kg (137 lb)   SpO2 100%   BMI 25.47 kg/m   Physical Exam  Constitutional: She is oriented to person, place, and time. She appears well-developed and well-nourished. No distress.  HENT:  Head: Normocephalic and atraumatic.  Mouth/Throat: Uvula is midline and oropharynx is clear and moist. Mucous membranes are dry.  Eyes: Pupils are equal, round, and reactive to light. Conjunctivae and EOM are normal.  Neck: Normal range of motion.  Cardiovascular: Normal rate, regular rhythm and intact distal pulses.   Pulmonary/Chest: Effort normal and breath sounds normal. No respiratory distress. She has no wheezes. She has no rhonchi. She has no rales.  Abdominal: Soft. Normal appearance and bowel sounds are normal. She exhibits no distension. There is tenderness in the right upper quadrant, epigastric area and left upper quadrant. There is no rigidity, no rebound, no guarding, no tenderness at McBurney's point and negative Murphy's sign.  Musculoskeletal: Normal range of motion.  Neurological: She is alert and oriented to person, place, and time.  Skin: Skin is warm and dry.  Psychiatric: She has a normal mood and affect.  Nursing note and vitals reviewed.    ED Treatments / Results  Labs (all labs ordered are listed, but only abnormal results are displayed) Labs Reviewed  CBC - Abnormal; Notable for the following:       Result Value   WBC 14.9 (*)    RBC  5.47 (*)    Hemoglobin 16.6 (*)    HCT 47.1 (*)    All other components within normal limits  COMPREHENSIVE METABOLIC PANEL - Abnormal; Notable for the following:    Potassium 3.3 (*)    CO2 19 (*)    Total Protein 8.8 (*)    Albumin 5.1 (*)    Total Bilirubin 2.6 (*)    Anion gap 16 (*)    All other components within normal limits  URINALYSIS, ROUTINE W REFLEX MICROSCOPIC - Abnormal; Notable for the following:    Color, Urine AMBER (*)    APPearance CLOUDY (*)    Hgb urine dipstick SMALL (*)    Ketones, ur 20 (*)    Protein, ur 30 (*)    Nitrite POSITIVE (*)    Leukocytes, UA SMALL (*)    Bacteria, UA MANY (*)    Squamous Epithelial / LPF 6-30 (*)    All other components within normal limits  RAPID URINE DRUG SCREEN, HOSP PERFORMED - Abnormal; Notable for the following:  Opiates POSITIVE (*)    Benzodiazepines POSITIVE (*)    Tetrahydrocannabinol POSITIVE (*)    All other components within normal limits  LIPASE, BLOOD  PREGNANCY, URINE    EKG  EKG Interpretation None       Radiology US Abdomen Limited Ruq  Result Date: 12/09/2016 CLINICAL DATA:  Right upper quadrant pain. EXAM: ULTRASOUND ABDOMEN LIMITED RIGHT UPPER QUADRANT COMPARISON:  06/22/2016 and CT 04/24/2016 FINDINGS: Gallbladder: No gallstones or wall thickening visualized. No sonographic Murphy sign noted by sonographer. Common bile duct: Diameter: 6 mm. Liver: No focal lesion identified. Within normal limits in parenchymal echogenicity. Portal vein is patent on color Doppler imaging with normal direction of blood flow towards the liver. IMPRESSION: Normal right upper quadrant ultrasound. Electronically Signed   By: Elberta Fortis M.D.   On: 12/09/2016 19:40    Procedures Procedures (including critical care time)  Medications Ordered in ED Medications  ondansetron (ZOFRAN) injection 4 mg (4 mg Intravenous Given 12/09/16 1757)  sodium chloride 0.9 % bolus 1,000 mL (0 mLs Intravenous Stopped 12/09/16  2007)  morphine 4 MG/ML injection 4 mg (4 mg Intravenous Given 12/09/16 1757)  promethazine (PHENERGAN) injection 25 mg (25 mg Intravenous Given 12/09/16 2009)  sodium chloride 0.9 % bolus 1,000 mL (0 mLs Intravenous Stopped 12/09/16 2227)     Initial Impression / Assessment and Plan / ED Course  I have reviewed the triage vital signs and the nursing notes.  Pertinent labs & imaging results that were available during my care of the patient were reviewed by me and considered in my medical decision making (see chart for details).     Pt presenting with intermittent abd pain with n/v over the past several days. Frequent evaluation for the same, often related to marijuana use. Physical exam showed pt is afebrile and not tachycardic. Abd exam with TTP of upper abd. No rigidity or distention. Will order basic abd labs, UA, preg, and UDS. Will start IVF, morphine for pain and zofran for nausea.   On reassessment, pt states pain is resolved, but nausea persists. CMP shows bili slightly elevated at 2.6, slight hypokalemia at 3.3. WBC at 14.9. Lipase negative. Urine pending, as pt states she does not need to urinate. Will give phenergan for nausea. RUQ [place] for cholelithiasis r/o d/t elevated bili and pain.   UA negative for gallbladder abnormality. Will give 2nd bolus to encourage urination. Nausea improved with phenergan.   UA shows UTI. UDS positive for benzos and THC (UDS obtained after morphine given in ED). At this time, doubt appendicitis, perforation, obstruction, or acute abd. I do not believe we need CT scan today. Leukocytosis likely due to UTI. PO challenge done, and pt tolerated well. Discussed findings with pt. Discussed she is to stop using marijuana or she will likely continue to have N/V. Pt to f/u with PCP as needed. Strict return precautions given. Pt states she understands and agrees to plan.    Final Clinical Impressions(s) / ED Diagnoses   Final diagnoses:  Upper abdominal pain    Urinary tract infection without hematuria, site unspecified  Non-intractable vomiting with nausea, unspecified vomiting type    New Prescriptions New Prescriptions   NITROFURANTOIN, MACROCRYSTAL-MONOHYDRATE, (MACROBID) 100 MG CAPSULE    Take 1 capsule (100 mg total) by mouth 2 (two) times daily.   POTASSIUM CHLORIDE (K-DUR) 10 MEQ TABLET    Take 1 tablet (10 mEq total) by mouth daily.   PROMETHAZINE (PHENERGAN) 25 MG TABLET    Take 1  tablet (25 mg total) by mouth every 8 (eight) hours as needed for nausea or vomiting.     Alveria Apley, PA-C 12/09/16 2304    Jacalyn Lefevre, MD 12/12/16 [phone]

## 2016-12-09 NOTE — ED Notes (Signed)
Patient still unable to urinate at this time, provider made aware and ordered more fluids to assist.

## 2016-12-09 NOTE — ED Notes (Signed)
Pt is aware a urine sample is needed but is unable to provide one at this time. 

## 2017-08-19 ENCOUNTER — Emergency Department (HOSPITAL_BASED_OUTPATIENT_CLINIC_OR_DEPARTMENT_OTHER)
Admission: EM | Admit: 2017-08-19 | Discharge: 2017-08-19 | Disposition: A | Discharge status: Home / Self Care | Payer: BLUE CROSS/BLUE SHIELD | Attending: Emergency Medicine | Admitting: Emergency Medicine

## 2017-08-19 ENCOUNTER — Other Ambulatory Visit: Payer: Self-pay

## 2017-08-19 ENCOUNTER — Encounter (HOSPITAL_BASED_OUTPATIENT_CLINIC_OR_DEPARTMENT_OTHER): Payer: Self-pay

## 2017-08-19 DIAGNOSIS — J302 Other seasonal allergic rhinitis: Secondary | ICD-10-CM | POA: Diagnosis not present

## 2017-08-19 DIAGNOSIS — R0981 Nasal congestion: Secondary | ICD-10-CM | POA: Diagnosis not present

## 2017-08-19 DIAGNOSIS — R05 Cough: Secondary | ICD-10-CM | POA: Diagnosis present

## 2017-08-19 DIAGNOSIS — Z79899 Other long term (current) drug therapy: Secondary | ICD-10-CM | POA: Diagnosis not present

## 2017-08-19 DIAGNOSIS — F900 Attention-deficit hyperactivity disorder, predominantly inattentive type: Secondary | ICD-10-CM | POA: Insufficient documentation

## 2017-08-19 MED ORDER — CETIRIZINE HCL 10 MG PO TABS: 10.0000 mg | ORAL_TABLET | Freq: Every day | ORAL | 0 refills | Status: DC

## 2017-08-19 MED ORDER — OXYMETAZOLINE HCL 0.05 % NA SOLN
1.0000 | Freq: Once | NASAL | Status: AC
  Administered 2017-08-19: 1 via NASAL
  Filled 2017-08-19: qty 15

## 2017-08-19 MED ORDER — FLUTICASONE PROPIONATE 50 MCG/ACT NA SUSP: 2.0000 | Freq: Every day | NASAL | 0 refills | Status: DC

## 2017-08-19 NOTE — Discharge Instructions (Signed)
Try zyrtec daily.   Also try flonase to both nostrils daily   You may use afrin 3 times daily up to 3 days   See your doctor  Return to ER if you have worse congestion, fever, severe headaches, worse sore throat, dehydration

## 2017-08-19 NOTE — ED Triage Notes (Signed)
C/o flu like sx day 2-NAD-steady gait 

## 2017-08-19 NOTE — ED Notes (Signed)
Pt is c/o upper respiratory congestion with cough, sinus pressure, and sore throat that started yesterday

## 2017-08-19 NOTE — ED Notes (Signed)
ED Provider at bedside. 

## 2017-08-19 NOTE — ED Provider Notes (Signed)
[facility] EMERGENCY DEPARTMENT Provider Note   CSN: [id number] Arrival date & time: 08/19/17  2201     History   Chief Complaint Chief Complaint  Patient presents with  . Cough    HPI Lindsey Sherman is a 22 y.o. female history of previous seasonal allergies with sinus congestion, here presenting with sinus congestion, runny nose, sore throat, cough.  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  She states that for the last 2 days, she noticed some sinus congestion as well as postnasal drip.  She also has some yellowish sinus discharge.  She has some sore throat and nonproductive cough as well.  Denies any fevers. Shee states that she gets bad seasonal allergies in the springtime that usually improves with Flonase and Zyrtec.   The history is provided by the patient.    Past Medical History:  Diagnosis Date  . Colitis   . H/O hernia repair 2008   done at [facility]    Patient Active Problem List   Diagnosis Date Noted  . Attention deficit hyperactivity disorder 04/26/2012  . 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 h/o hernia repair  2008  . HERNIA REPAIR       OB History   None      Home Medications    Prior to Admission medications   Medication Sig Start Date End Date Taking? Authorizing Provider  etonogestrel (NEXPLANON) 68 MG IMPL implant 1 each by Subdermal route once.    [provider]  metoCLOPramide (REGLAN) 10 MG tablet Take 10 mg by mouth every 6 (six) hours as needed for nausea or vomiting.     [provider]  potassium chloride (K-DUR) 10 MEQ tablet Take 1 tablet (10 mEq total) by mouth daily. 12/09/16 12/16/16  Caccavale, Sophia, PA-C  promethazine (PHENERGAN) 25 MG suppository Place 1 suppository (25 mg total) rectally every 6 (six) hours as needed for nausea or vomiting. Patient not taking:  Reported on 12/09/2016 08/18/16   Dione Booze, MD  promethazine (PHENERGAN) 25 MG tablet Take 1 tablet (25 mg total) by mouth every 6 (six) hours as needed for nausea or vomiting. 08/18/16   Dione Booze, MD  promethazine (PHENERGAN) 25 MG tablet Take 1 tablet (25 mg total) by mouth every 8 (eight) hours as needed for nausea or vomiting. 12/09/16   Caccavale, Sophia, PA-C    Family History Family History  Problem Relation Age of Onset  . Hypertension Mother   . Diabetes Maternal Grandmother   . Hypertension Maternal Grandmother   . Osteoarthritis Maternal Grandmother   . Heart failure Maternal Grandmother   . Stroke Maternal Grandmother     Social History Social History   Tobacco Use  . Smoking status: Never Smoker  . Smokeless tobacco: Never Used  Substance Use Topics  . Alcohol use: Yes    Comment: occ  . Drug use: Yes    Types: Marijuana     Allergies   Amoxicillin and Penicillins   Review of Systems Review of Systems  HENT: Positive for congestion.   Respiratory: Positive for cough.   All other systems reviewed and are negative.    Physical Exam Updated Vital Signs BP (!) 149/106 (BP Location: Left Arm)   Pulse 93   Temp 98.2 F (36.8 C) (Oral)   Resp 18   Ht 5\' 2"  (1.575 m)  Wt 60.8 kg (134 lb)   SpO2 100%   BMI 24.51 kg/m   Physical Exam  Constitutional: She is oriented to person, place, and time. She appears well-developed.  HENT:  Head: Normocephalic.  Right Ear: External ear normal.  Left Ear: External ear normal.  Mouth/Throat: Oropharynx is clear and moist.  No nasal discharge, mild maxillary sinus tenderness. Posterior pharynx clear   Eyes: Pupils are equal, round, and reactive to light. Conjunctivae and EOM are normal.  Neck: Normal range of motion. Neck supple.  Cardiovascular: Normal rate, regular rhythm and normal heart sounds.  Pulmonary/Chest: Effort normal and breath sounds normal. No stridor. No respiratory distress.  Abdominal:  Soft. Bowel sounds are normal. She exhibits no distension. There is no tenderness.  Musculoskeletal: Normal range of motion.  Lymphadenopathy:    She has no cervical adenopathy.  Neurological: She is alert and oriented to person, place, and time. No cranial nerve deficit. Coordination normal.  Skin: Skin is warm.  Psychiatric: She has a normal mood and affect.  Nursing note and vitals reviewed.    ED Treatments / Results  Labs (all labs ordered are listed, but only abnormal results are displayed) Labs Reviewed - No data to display  EKG None  Radiology No results found.  Procedures Procedures (including critical care time)  Medications Ordered in ED Medications  oxymetazoline (AFRIN) 0.05 % nasal spray 1 spray (1 spray Each Nare Given 08/19/17 2236)     Initial Impression / Assessment and Plan / ED Course  I have reviewed the triage vital signs and the nursing notes.  Pertinent labs & imaging results that were available during my care of the patient were reviewed by me and considered in my medical decision making (see chart for details).     Lindsey Sherman is a 22 y.o. female here with congestion, sore throat, cough. Likely seasonal allergies vs viral syndrome. Afebrile, well appearing. I doubt bacterial sinusitis and has no signs of pharyngitis. Will dc home with zyrtec, flonase, prn afrin for several days.    Final Clinical Impressions(s) / ED Diagnoses   Final diagnoses:  None    ED Discharge Orders    None       Charlynne PanderYao, Jeri Rawlins Hsienta, MD 08/19/17 2243

## 2018-06-28 ENCOUNTER — Emergency Department (HOSPITAL_BASED_OUTPATIENT_CLINIC_OR_DEPARTMENT_OTHER)
Admission: EM | Admit: 2018-06-28 | Discharge: 2018-06-28 | Disposition: A | Discharge status: Home / Self Care | Payer: BLUE CROSS/BLUE SHIELD | Attending: Emergency Medicine | Admitting: Emergency Medicine

## 2018-06-28 ENCOUNTER — Other Ambulatory Visit: Payer: Self-pay

## 2018-06-28 ENCOUNTER — Encounter (HOSPITAL_BASED_OUTPATIENT_CLINIC_OR_DEPARTMENT_OTHER): Payer: Self-pay

## 2018-06-28 DIAGNOSIS — Z79899 Other long term (current) drug therapy: Secondary | ICD-10-CM | POA: Insufficient documentation

## 2018-06-28 DIAGNOSIS — Y93G1 Activity, food preparation and clean up: Secondary | ICD-10-CM | POA: Insufficient documentation

## 2018-06-28 DIAGNOSIS — Y990 Civilian activity done for income or pay: Secondary | ICD-10-CM | POA: Diagnosis not present

## 2018-06-28 DIAGNOSIS — Y929 Unspecified place or not applicable: Secondary | ICD-10-CM | POA: Insufficient documentation

## 2018-06-28 DIAGNOSIS — S61011A Laceration without foreign body of right thumb without damage to nail, initial encounter: Secondary | ICD-10-CM | POA: Insufficient documentation

## 2018-06-28 DIAGNOSIS — W260XXA Contact with knife, initial encounter: Secondary | ICD-10-CM | POA: Diagnosis not present

## 2018-06-28 DIAGNOSIS — S61002A Unspecified open wound of left thumb without damage to nail, initial encounter: Secondary | ICD-10-CM

## 2018-06-28 DIAGNOSIS — S6991XA Unspecified injury of right wrist, hand and finger(s), initial encounter: Secondary | ICD-10-CM | POA: Diagnosis present

## 2018-06-28 LAB — PREGNANCY, URINE: Preg Test, Ur: NEGATIVE

## 2018-06-28 MED ORDER — DOXYCYCLINE HYCLATE 100 MG PO CAPS: 100.0000 mg | ORAL_CAPSULE | Freq: Two times a day (BID) | ORAL | 0 refills | Status: AC

## 2018-06-28 MED ORDER — HYDROCODONE-ACETAMINOPHEN 5-325 MG PO TABS
1.0000 | ORAL_TABLET | Freq: Once | ORAL | Status: AC
  Administered 2018-06-28: 1 via ORAL
  Filled 2018-06-28: qty 1

## 2018-06-28 NOTE — ED Provider Notes (Signed)
[facility] EMERGENCY DEPARTMENT Provider Note   CSN: [id number] Arrival date & time: 06/28/18  1909    History   Chief Complaint Chief Complaint  Patient presents with  . Finger Injury    HPI Lindsey Sherman is a 23 y.o. female with a history of ADHD presenting to emergency department today with chief complaint of laceration to left thumb.  This happened 30 hours ago.  She states she was at work using a knife to cut lettuce and the knife slipped off the piece of lettuce and landed on her left arm.  The knife was new and very sharp.  She has pain localized to the laceration that she describes as throbbing.  She rates it 5 out of 10 in severity.  She has taken ibuprofen without symptom relief.    She noticed purulent discharge this morning when cleaning the wound.  She denies any fever or chills, numbness or weakness of the thumb.  Patient's tetanus is up-to-date.  Patient is right-hand dominant. History provided by patient.    Past Medical History:  Diagnosis Date  . Colitis   . H/O hernia repair 2008   done at [facility]    Patient Active Problem List   Diagnosis Date Noted  . Attention deficit hyperactivity disorder 04/26/2012  . 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 h/o hernia repair  2008  . HERNIA REPAIR       OB History   No obstetric history on file.      Home Medications    Prior to Admission medications   Medication Sig Start Date End Date Taking? Authorizing Provider  cetirizine (ZYRTEC ALLERGY) 10 MG tablet Take 1 tablet (10 mg total) by mouth daily. 08/19/17   Charlynne Pander, MD  doxycycline (VIBRAMYCIN) 100 MG capsule Take 1 capsule (100 mg total) by mouth 2 (two) times daily for 7 days. 06/28/18 07/05/18  Nalleli Largent E, PA-C  etonogestrel (NEXPLANON) 68 MG IMPL implant 1 each by Subdermal route once.    [provider]  fluticasone (FLONASE)  50 MCG/ACT nasal spray Place 2 sprays into both nostrils daily. 08/19/17   Charlynne Pander, MD  metoCLOPramide (REGLAN) 10 MG tablet Take 10 mg by mouth every 6 (six) hours as needed for nausea or vomiting.     [provider]  potassium chloride (K-DUR) 10 MEQ tablet Take 1 tablet (10 mEq total) by mouth daily. 12/09/16 12/16/16  Caccavale, Sophia, PA-C  promethazine (PHENERGAN) 25 MG suppository Place 1 suppository (25 mg total) rectally every 6 (six) hours as needed for nausea or vomiting. Patient not taking: Reported on 12/09/2016 08/18/16   Dione Booze, MD  promethazine (PHENERGAN) 25 MG tablet Take 1 tablet (25 mg total) by mouth every 6 (six) hours as needed for nausea or vomiting. 08/18/16   Dione Booze, MD  promethazine (PHENERGAN) 25 MG tablet Take 1 tablet (25 mg total) by mouth every 8 (eight) hours as needed for nausea or vomiting. 12/09/16   Caccavale, Sophia, PA-C    Family History Family History  Problem Relation Age of Onset  . Hypertension Mother   . Diabetes Maternal Grandmother   . Hypertension Maternal Grandmother   . Osteoarthritis Maternal Grandmother   . Heart failure Maternal Grandmother   . Stroke Maternal Grandmother     Social History Social History   Tobacco Use  . Smoking status: Never Smoker  . Smokeless tobacco: Never  Used  Substance Use Topics  . Alcohol use: Yes    Comment: occ  . Drug use: Yes    Types: Marijuana     Allergies   Amoxicillin and Penicillins   Review of Systems Review of Systems  Constitutional: Negative for chills and fever.  Skin: Positive for wound.     Physical Exam Updated Vital Signs BP 122/79 (BP Location: Right Arm)   Pulse 68   Temp 98.6 F (37 C) (Oral)   Resp 18   Ht 5\' 1"  (1.549 m)   Wt 62.6 kg   LMP 05/15/2018   SpO2 100%   BMI 26.07 kg/m   Physical Exam Vitals signs and nursing note reviewed.  Constitutional:      Appearance: She is well-developed. She is not toxic-appearing.   HENT:     Head: Normocephalic and atraumatic.  Eyes:     General: No scleral icterus.       Right eye: No discharge.        Left eye: No discharge.     Conjunctiva/sclera: Conjunctivae normal.  Neck:     Musculoskeletal: Normal range of motion.  Cardiovascular:     Rate and Rhythm: Normal rate and regular rhythm.     Pulses: Normal pulses.          Radial pulses are 2+ on the right side and 2+ on the left side.     Heart sounds: Normal heart sounds.  Pulmonary:     Effort: Pulmonary effort is normal.     Breath sounds: Normal breath sounds.  Abdominal:     General: There is no distension.  Musculoskeletal: Normal range of motion.     Comments: Grip strength is symmetrical and  5/5 in bilateral hands. Resistance and strength intact of left thumb. Neurovascularly intact distally. Compartments soft below affected joint.   Skin:    General: Skin is warm and dry.     Comments: Avulsion to tip of left thumb.  The nailbed is not involved.  Bleeding is controlled.  Surrounding tissue is soft and well appearing.  No surrounding erythema, warmth, or edema. No purulent drainage.  Neurological:     Mental Status: She is oriented to person, place, and time.     Comments: Fluent speech, no facial droop.  Psychiatric:        Behavior: Behavior normal.      ED Treatments / Results  Labs (all labs ordered are listed, but only abnormal results are displayed) Labs Reviewed  PREGNANCY, URINE    EKG None  Radiology No results found.  Procedures Procedures (including critical care time)  Medications Ordered in ED Medications  HYDROcodone-acetaminophen (NORCO/VICODIN) 5-325 MG per tablet 1 tablet (1 tablet Oral Given 06/28/18 2259)     Initial Impression / Assessment and Plan / ED Course  I have reviewed the triage vital signs and the nursing notes.  Pertinent labs & imaging results that were available during my care of the patient were reviewed by me and considered in my medical  decision making (see chart for details).   Patient presents to the emergency department with skin avulsion of left thumb which occurred >24 hours prior to arrival . Patient nontoxic appearing, resting comfortably. On exam she has a small piece missing from tup of left thumb. Full ROM of thumb, resistance intact, no bleeding or surrounding signs of infection. Pressure irrigation performed. Wound explored and base of wound visualized in a bloodless field without evidence of foreign body. Avulsion does  not need laceration repair. Thumb dressed with xeroform and bulky dressing.Tetanus is up to date. Will discharge home with prescription for doxycycline given she reports purulent drainage. Discussed wound home care as well as need for wound recheck in 2 days with pcp.  I discussed results, treatment plan, need for follow-up, and return precautions with the patient including signs of infection. Provided opportunity for questions, patient confirmed understanding and is in agreement with plan.       Final Clinical Impressions(s) / ED Diagnoses   Final diagnoses:  Avulsion of skin of left thumb, initial encounter    ED Discharge Orders         Ordered    doxycycline (VIBRAMYCIN) 100 MG capsule  2 times daily     06/28/18 2314           Abdur Hoglund, Caroleen Hamman, PA-C 06/28/18 2341    Alvira Monday, MD 06/29/18 1623

## 2018-06-28 NOTE — Discharge Instructions (Addendum)
You have been seen today for thumb wound. Please read and follow all provided instructions. Return to the emergency room for worsening condition or new concerning symptoms.    1. Medications:  Prescription to pharmacy for doxycycline.  This is an antibiotic, please take as prescribed. Continue usual home medications Take medications as prescribed. Please review all of the medicines and only take them if you do not have an allergy to them.   2. Treatment: rest, drink plenty of fluids.  Keep the bandage on for 24 hours.  After that you can wash the wound daily with soap and water.  Watch for signs of infection including surrounding redness, draining pus, fever.  3. Follow Up: Please follow up with your primary doctor in 2-5 days for discussion of your diagnoses and further evaluation after today's visit; Call today to arrange your follow up.    It is also a possibility that you have an allergic reaction to any of the medicines that you have been prescribed - Everybody reacts differently to medications and while MOST people have no trouble with most medicines, you may have a reaction such as nausea, vomiting, rash, swelling, shortness of breath. If this is the case, please stop taking the medicine immediately and contact your physician.  ?

## 2018-06-28 NOTE — ED Triage Notes (Signed)
Pt states she was cutting lettuce at work yesterday and cut left thumb-slight avulsed area noted-NAD-steady gait

## 2018-09-10 ENCOUNTER — Emergency Department (HOSPITAL_BASED_OUTPATIENT_CLINIC_OR_DEPARTMENT_OTHER): Payer: BC Managed Care – PPO

## 2018-09-10 ENCOUNTER — Emergency Department (HOSPITAL_BASED_OUTPATIENT_CLINIC_OR_DEPARTMENT_OTHER)
Admission: EM | Admit: 2018-09-10 | Discharge: 2018-09-10 | Disposition: A | Discharge status: Home / Self Care | Payer: BC Managed Care – PPO | Attending: Emergency Medicine | Admitting: Emergency Medicine

## 2018-09-10 ENCOUNTER — Other Ambulatory Visit: Payer: Self-pay

## 2018-09-10 DIAGNOSIS — Z20828 Contact with and (suspected) exposure to other viral communicable diseases: Secondary | ICD-10-CM | POA: Insufficient documentation

## 2018-09-10 DIAGNOSIS — R05 Cough: Secondary | ICD-10-CM | POA: Diagnosis present

## 2018-09-10 DIAGNOSIS — Z79899 Other long term (current) drug therapy: Secondary | ICD-10-CM | POA: Diagnosis not present

## 2018-09-10 DIAGNOSIS — F900 Attention-deficit hyperactivity disorder, predominantly inattentive type: Secondary | ICD-10-CM | POA: Insufficient documentation

## 2018-09-10 DIAGNOSIS — R059 Cough, unspecified: Secondary | ICD-10-CM

## 2018-09-10 DIAGNOSIS — J22 Unspecified acute lower respiratory infection: Secondary | ICD-10-CM | POA: Insufficient documentation

## 2018-09-10 DIAGNOSIS — J988 Other specified respiratory disorders: Secondary | ICD-10-CM

## 2018-09-10 MED ORDER — BENZONATATE 100 MG PO CAPS: 100.0000 mg | ORAL_CAPSULE | Freq: Three times a day (TID) | ORAL | 0 refills | Status: DC

## 2018-09-10 NOTE — Discharge Instructions (Signed)
Please read and follow all provided instructions.  Your diagnoses today include:  1. Cough   2. Respiratory tract infection     Tests performed today include:  Your chest x-ray was negative for any signs of pneumonia or other problems  Coronavirus test -this is pending, check your MyChart in the next 48 to 72 hours for results.  Vital signs. See below for your results today.   Medications prescribed:   Tessalon Perles - cough suppressant medication  Take any prescribed medications only as directed.  Home care instructions:  Follow any educational materials contained in this packet.  Use Tylenol or ibuprofen/naproxen over-the-counter for any body aches or fever.  BE VERY CAREFUL not to take multiple medicines containing Tylenol (also called acetaminophen). Doing so can lead to an overdose which can damage your liver and cause liver failure and possibly death.   Follow-up instructions: Please follow-up with your primary care provider in the next 3-5 days for further evaluation of your symptoms if you are not feeling better.  Return instructions:   Please return to the Emergency Department if you experience worsening symptoms.   Return with worsening shortness of breath, trouble breathing, persistent vomiting.  Please return if you have any other emergent concerns.  Additional Information:  Your vital signs today were: BP (!) 140/97 (BP Location: Right Arm)    Pulse 78    Temp 98.6 F (37 C) (Oral)    Resp 18    Ht 5\' 1"  (1.549 m)    Wt 61.7 kg    LMP 08/20/2018 (Approximate)    SpO2 100%    BMI 25.70 kg/m  If your blood pressure (BP) was elevated above 135/85 this visit, please have this repeated by your doctor within one month. --------------

## 2018-09-10 NOTE — ED Triage Notes (Signed)
Pt states "I feel really really bad". Pt states nasal congestion, cough, with pain back pain, sore throat x 3 days.  Pt denies fever, states feels sweating, no chills. Verbalizes diarrhea, N/V yesterday.

## 2018-09-10 NOTE — ED Provider Notes (Signed)
[facility] EMERGENCY DEPARTMENT Provider Note   CSN: [id number] Arrival date & time: 09/10/18  1313     History   Chief Complaint Chief Complaint  Patient presents with  . Cough    HPI Lindsey Sherman is a 23 y.o. female.     Patient presents to the emergency department with complaint of cough, body aches, malaise for the past 3 days.  Initially she had a congestion which has progressed.  Cough is nonproductive.  She has had chills and sweats, she has not checked her temperature.  She states that she is around people who do not wear masks at work [employer]) and is a [employer] farm manager.  No definitive coronavirus contacts.  She had some nausea and posttussive emesis yesterday.  Also with some diarrhea at times.  She denies headache.  No UTI symptoms.  No treatments prior to arrival.     Past Medical History:  Diagnosis Date  . Colitis   . H/O hernia repair 2008   done at [facility]    Patient Active Problem List   Diagnosis Date Noted  . Attention deficit hyperactivity disorder 04/26/2012  . 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 h/o hernia repair  2008  . HERNIA REPAIR       OB History   No obstetric history on file.      Home Medications    Prior to Admission medications   Medication Sig Start Date End Date Taking? Authorizing Provider  cetirizine (ZYRTEC ALLERGY) 10 MG tablet Take 1 tablet (10 mg total) by mouth daily. 08/19/17   Drenda Freeze, MD  etonogestrel (NEXPLANON) 68 MG IMPL implant 1 each by Subdermal route once.    [provider]  fluticasone (FLONASE) 50 MCG/ACT nasal spray Place 2 sprays into both nostrils daily. 08/19/17   Drenda Freeze, MD  metoCLOPramide (REGLAN) 10 MG tablet Take 10 mg by mouth every 6 (six) hours as needed for nausea or vomiting.     [provider]  potassium chloride (K-DUR) 10 MEQ tablet Take 1 tablet  (10 mEq total) by mouth daily. 12/09/16 12/16/16  Caccavale, Sophia, PA-C  promethazine (PHENERGAN) 25 MG suppository Place 1 suppository (25 mg total) rectally every 6 (six) hours as needed for nausea or vomiting. Patient not taking: Reported on 71/07/2692 8/54/62   Delora Fuel, MD  promethazine (PHENERGAN) 25 MG tablet Take 1 tablet (25 mg total) by mouth every 6 (six) hours as needed for nausea or vomiting. 08/31/48   Delora Fuel, MD  promethazine (PHENERGAN) 25 MG tablet Take 1 tablet (25 mg total) by mouth every 8 (eight) hours as needed for nausea or vomiting. 12/09/16   Caccavale, Sophia, PA-C    Family History Family History  Problem Relation Age of Onset  . Hypertension Mother   . Diabetes Maternal Grandmother   . Hypertension Maternal Grandmother   . Osteoarthritis Maternal Grandmother   . Heart failure Maternal Grandmother   . Stroke Maternal Grandmother     Social History Social History   Tobacco Use  . Smoking status: Never Smoker  . Smokeless tobacco: Never Used  Substance Use Topics  . Alcohol use: Yes    Comment: occ  . Drug use: Yes    Types: Marijuana     Allergies   Amoxicillin and Penicillins   Review of Systems Review of Systems  Constitutional: Positive for chills and fatigue. Negative for fever.  HENT: Positive for congestion. Negative for rhinorrhea and sore throat.   Eyes: Negative for redness.  Respiratory: Positive for cough and shortness of breath.   Cardiovascular: Negative for chest pain.  Gastrointestinal: Positive for diarrhea, nausea and vomiting. Negative for abdominal pain.  Genitourinary: Negative for dysuria.  Musculoskeletal: Positive for myalgias.  Skin: Negative for rash.  Neurological: Negative for headaches.     Physical Exam Updated Vital Signs BP (!) 140/97 (BP Location: Right Arm)   Pulse 78   Temp 98.6 F (37 C) (Oral)   Resp 18   Ht 5\' 1"  (1.549 m)   Wt 61.7 kg   LMP 08/20/2018 (Approximate)   SpO2 100%    BMI 25.70 kg/m   Physical Exam Vitals signs and nursing note reviewed.  Constitutional:      Appearance: She is well-developed.  HENT:     Head: Normocephalic and atraumatic.     Right Ear: Tympanic membrane, ear canal and external ear normal.     Left Ear: Tympanic membrane, ear canal and external ear normal.     Nose: Congestion and rhinorrhea present.  Eyes:     General:        Right eye: No discharge.        Left eye: No discharge.     Conjunctiva/sclera: Conjunctivae normal.  Neck:     Musculoskeletal: Normal range of motion and neck supple.  Cardiovascular:     Rate and Rhythm: Normal rate and regular rhythm.     Heart sounds: Normal heart sounds.  Pulmonary:     Effort: Pulmonary effort is normal.     Breath sounds: Normal breath sounds.  Abdominal:     Palpations: Abdomen is soft.     Tenderness: There is no abdominal tenderness.  Skin:    General: Skin is warm and dry.  Neurological:     Mental Status: She is alert.      ED Treatments / Results  Labs (all labs ordered are listed, but only abnormal results are displayed) Labs Reviewed  NOVEL CORONAVIRUS, NAA (HOSPITAL ORDER, SEND-OUT TO REF LAB)    EKG None  Radiology Dg Chest Portable 1 View  Result Date: 09/10/2018 CLINICAL DATA:  Fever, cough and body aches for 3 days. EXAM: PORTABLE CHEST 1 VIEW COMPARISON:  PA and lateral chest 11/26/2008. FINDINGS: Lungs are clear. Heart size is normal. No pneumothorax or pleural fluid. No acute or focal bony abnormality. Scoliosis noted. IMPRESSION: No acute disease. Scoliosis. Electronically Signed   By: Drusilla Kannerhomas  Dalessio M.D.   On: 09/10/2018 14:24    Procedures Procedures (including critical care time)  Medications Ordered in ED Medications - No data to display   Initial Impression / Assessment and Plan / ED Course  I have reviewed the triage vital signs and the nursing notes.  Pertinent labs & imaging results that were available during my care of the  patient were reviewed by me and considered in my medical decision making (see chart for details).        Patient seen and examined.  Will check chest x-ray, treat cough.  Will check coronavirus testing.  Patient advised to isolate herself until testing results.  We discussed 10+3 rule if testing is positive.  Encouraged return to the emergency department with worsening symptoms, shortness of breath, increased work of breathing, persistent vomiting, new symptoms or other concerns.  Vital signs reviewed and are as follows: BP (!) 140/97 (BP Location: Right Arm)   Pulse 78   Temp  98.6 F (37 C) (Oral)   Resp 18   Ht 5\' 1"  (1.549 m)   Wt 61.7 kg   LMP 08/20/2018 (Approximate)   SpO2 100%   BMI 25.70 kg/m   CXR neg.  Pt ready for d/c.    Final Clinical Impressions(s) / ED Diagnoses   Final diagnoses:  Cough  Respiratory tract infection   Patient with 3 days of cough, respiratory symptoms, GI symptoms, possible subjective fever.  No definitive contacts however symptoms are concerning for coronavirus and patient has been tested today.  Chest x-ray is clear.  Vital signs are reassuring.  No indications for further blood testing or hospitalization.  Patient given instructions to isolate at home.   ED Discharge Orders         Ordered    benzonatate (TESSALON) 100 MG capsule  Every 8 hours     09/10/18 1501           Renne CriglerGeiple, Darrian Goodwill, PA-C 09/10/18 1503    Alvira MondaySchlossman, Erin, MD 09/12/18 1119

## 2018-09-12 LAB — NOVEL CORONAVIRUS, NAA (HOSP ORDER, SEND-OUT TO REF LAB; TAT 18-24 HRS): SARS-CoV-2, NAA: NOT DETECTED

## 2018-10-24 ENCOUNTER — Other Ambulatory Visit: Payer: Self-pay

## 2018-10-24 ENCOUNTER — Encounter: Payer: Self-pay | Admitting: Advanced Practice Midwife

## 2018-10-24 ENCOUNTER — Ambulatory Visit (INDEPENDENT_AMBULATORY_CARE_PROVIDER_SITE_OTHER): Payer: BC Managed Care – PPO | Admitting: Advanced Practice Midwife

## 2018-10-24 VITALS — BP 119/70 | HR 61 | Wt 139.0 lb

## 2018-10-24 DIAGNOSIS — Z3A01 Less than 8 weeks gestation of pregnancy: Secondary | ICD-10-CM

## 2018-10-24 DIAGNOSIS — Z113 Encounter for screening for infections with a predominantly sexual mode of transmission: Secondary | ICD-10-CM | POA: Diagnosis not present

## 2018-10-24 DIAGNOSIS — Z3490 Encounter for supervision of normal pregnancy, unspecified, unspecified trimester: Secondary | ICD-10-CM

## 2018-10-24 DIAGNOSIS — Z3401 Encounter for supervision of normal first pregnancy, first trimester: Secondary | ICD-10-CM | POA: Insufficient documentation

## 2018-10-24 MED ORDER — PRENATAL VITAMIN 27-0.8 MG PO TABS: 1.0000 | ORAL_TABLET | Freq: Every day | ORAL | 12 refills | Status: DC

## 2018-10-24 NOTE — Patient Instructions (Signed)
First Trimester of Pregnancy The first trimester of pregnancy is from week 1 until the end of week 13 (months 1 through 3). A week after a sperm fertilizes an egg, the egg will implant on the wall of the uterus. This embryo will begin to develop into a baby. Genes from you and your partner will form the baby. The female genes will determine whether the baby will be a boy or a girl. At 6-8 weeks, the eyes and face will be formed, and the heartbeat can be seen on ultrasound. At the end of 12 weeks, all the baby's organs will be formed. Now that you are pregnant, you will want to do everything you can to have a healthy baby. Two of the most important things are to get good prenatal care and to follow your health care provider's instructions. Prenatal care is all the medical care you receive before the baby's birth. This care will help prevent, find, and treat any problems during the pregnancy and childbirth. Body changes during your first trimester Your body goes through many changes during pregnancy. The changes vary from woman to woman.  You may gain or lose a couple of pounds at first.  You may feel sick to your stomach (nauseous) and you may throw up (vomit). If the vomiting is uncontrollable, call your health care provider.  You may tire easily.  You may develop headaches that can be relieved by medicines. All medicines should be approved by your health care provider.  You may urinate more often. Painful urination may mean you have a bladder infection.  You may develop heartburn as a result of your pregnancy.  You may develop constipation because certain hormones are causing the muscles that push stool through your intestines to slow down.  You may develop hemorrhoids or swollen veins (varicose veins).  Your breasts may begin to grow larger and become tender. Your nipples may stick out more, and the tissue that surrounds them (areola) may become darker.  Your gums may bleed and may be  sensitive to brushing and flossing.  Dark spots or blotches (chloasma, mask of pregnancy) may develop on your face. This will likely fade after the baby is born.  Your menstrual periods will stop.  You may have a loss of appetite.  You may develop cravings for certain kinds of food.  You may have changes in your emotions from day to day, such as being excited to be pregnant or being concerned that something may go wrong with the pregnancy and baby.  You may have more vivid and strange dreams.  You may have changes in your hair. These can include thickening of your hair, rapid growth, and changes in texture. Some women also have hair loss during or after pregnancy, or hair that feels dry or thin. Your hair will most likely return to normal after your baby is born. What to expect at prenatal visits During a routine prenatal visit:  You will be weighed to make sure you and the baby are growing normally.  Your blood pressure will be taken.  Your abdomen will be measured to track your baby's growth.  The fetal heartbeat will be listened to between weeks 10 and 14 of your pregnancy.  Test results from any previous visits will be discussed. Your health care provider may ask you:  How you are feeling.  If you are feeling the baby move.  If you have had any abnormal symptoms, such as leaking fluid, bleeding, severe headaches, or abdominal   cramping.  If you are using any tobacco products, including cigarettes, chewing tobacco, and electronic cigarettes.  If you have any questions. Other tests that may be performed during your first trimester include:  Blood tests to find your blood type and to check for the presence of any previous infections. The tests will also be used to check for low iron levels (anemia) and protein on red blood cells (Rh antibodies). Depending on your risk factors, or if you previously had diabetes during pregnancy, you may have tests to check for high blood sugar  that affects pregnant women (gestational diabetes).  Urine tests to check for infections, diabetes, or protein in the urine.  An ultrasound to confirm the proper growth and development of the baby.  Fetal screens for spinal cord problems (spina bifida) and Down syndrome.  HIV (human immunodeficiency virus) testing. Routine prenatal testing includes screening for HIV, unless you choose not to have this test.  You may need other tests to make sure you and the baby are doing well. Follow these instructions at home: Medicines  Follow your health care provider's instructions regarding medicine use. Specific medicines may be either safe or unsafe to take during pregnancy.  Take a prenatal vitamin that contains at least 600 micrograms (mcg) of folic acid.  If you develop constipation, try taking a stool softener if your health care provider approves. Eating and drinking   Eat a balanced diet that includes fresh fruits and vegetables, whole grains, good sources of protein such as meat, eggs, or tofu, and low-fat dairy. Your health care provider will help you determine the amount of weight gain that is right for you.  Avoid raw meat and uncooked cheese. These carry germs that can cause birth defects in the baby.  Eating four or five small meals rather than three large meals a day may help relieve nausea and vomiting. If you start to feel nauseous, eating a few soda crackers can be helpful. Drinking liquids between meals, instead of during meals, also seems to help ease nausea and vomiting.  Limit foods that are high in fat and processed sugars, such as fried and sweet foods.  To prevent constipation: ? Eat foods that are high in fiber, such as fresh fruits and vegetables, whole grains, and beans. ? Drink enough fluid to keep your urine clear or pale yellow. Activity  Exercise only as directed by your health care provider. Most women can continue their usual exercise routine during  pregnancy. Try to exercise for 30 minutes at least 5 days a week. Exercising will help you: ? Control your weight. ? Stay in shape. ? Be prepared for labor and delivery.  Experiencing pain or cramping in the lower abdomen or lower back is a good sign that you should stop exercising. Check with your health care provider before continuing with normal exercises.  Try to avoid standing for long periods of time. Move your legs often if you must stand in one place for a long time.  Avoid heavy lifting.  Wear low-heeled shoes and practice good posture.  You may continue to have sex unless your health care provider tells you not to. Relieving pain and discomfort  Wear a good support bra to relieve breast tenderness.  Take warm sitz baths to soothe any pain or discomfort caused by hemorrhoids. Use hemorrhoid cream if your health care provider approves.  Rest with your legs elevated if you have leg cramps or low back pain.  If you develop varicose veins in   your legs, wear support hose. Elevate your feet for 15 minutes, 3-4 times a day. Limit salt in your diet. Prenatal care  Schedule your prenatal visits by the twelfth week of pregnancy. They are usually scheduled monthly at first, then more often in the last 2 months before delivery.  Write down your questions. Take them to your prenatal visits.  Keep all your prenatal visits as told by your health care provider. This is important. Safety  Wear your seat belt at all times when driving.  Make a list of emergency phone numbers, including numbers for family, friends, the hospital, and police and fire departments. General instructions  Ask your health care provider for a referral to a local prenatal education class. Begin classes no later than the beginning of month 6 of your pregnancy.  Ask for help if you have counseling or nutritional needs during pregnancy. Your health care provider can offer advice or refer you to specialists for help  with various needs.  Do not use hot tubs, steam rooms, or saunas.  Do not douche or use tampons or scented sanitary pads.  Do not cross your legs for long periods of time.  Avoid cat litter boxes and soil used by cats. These carry germs that can cause birth defects in the baby and possibly loss of the fetus by miscarriage or stillbirth.  Avoid all smoking, herbs, alcohol, and medicines not prescribed by your health care provider. Chemicals in these products affect the formation and growth of the baby.  Do not use any products that contain nicotine or tobacco, such as cigarettes and e-cigarettes. If you need help quitting, ask your health care provider. You may receive counseling support and other resources to help you quit.  Schedule a dentist appointment. At home, brush your teeth with a soft toothbrush and be gentle when you floss. Contact a health care provider if:  You have dizziness.  You have mild pelvic cramps, pelvic pressure, or nagging pain in the abdominal area.  You have persistent nausea, vomiting, or diarrhea.  You have a bad smelling vaginal discharge.  You have pain when you urinate.  You notice increased swelling in your face, hands, legs, or ankles.  You are exposed to fifth disease or chickenpox.  You are exposed to German measles (rubella) and have never had it. Get help right away if:  You have a fever.  You are leaking fluid from your vagina.  You have spotting or bleeding from your vagina.  You have severe abdominal cramping or pain.  You have rapid weight gain or loss.  You vomit blood or material that looks like coffee grounds.  You develop a severe headache.  You have shortness of breath.  You have any kind of trauma, such as from a fall or a car accident. Summary  The first trimester of pregnancy is from week 1 until the end of week 13 (months 1 through 3).  Your body goes through many changes during pregnancy. The changes vary from  woman to woman.  You will have routine prenatal visits. During those visits, your health care provider will examine you, discuss any test results you may have, and talk with you about how you are feeling. This information is not intended to replace advice given to you by your health care provider. Make sure you discuss any questions you have with your health care provider. Document Released: 02/09/2001 Document Revised: 01/28/2017 Document Reviewed: 01/28/2016 Elsevier Patient Education  2020 [employer]  

## 2018-10-24 NOTE — Progress Notes (Signed)
Patient complaining about reflux. Lindsey Alu RN  DATING AND VIABILITY SONOGRAM   Lindsey Sherman is a 23 y.o. year old G1P0 with LMP Patient's last menstrual period was 08/20/2018 (approximate). which would correlate to  [redacted]w[redacted]d weeks gestation.  She has irregular menstrual cycles.   She is here today for a confirmatory initial sonogram.    GESTATION: SINGLETON     FETAL ACTIVITY:          Heart rate         158 bpm          The fetus is active.      GESTATIONAL AGE AND  BIOMETRICS:  Gestational criteria: Estimated Date of Delivery: 06/07/19 by LMP now at [redacted]w[redacted]d  Previous Scans:0  GESTATIONAL SAC           2.92 cm        8-0 weeks  CROWN RUMP LENGTH           1.46 cm         7-5weeks                                                                               AVERAGE EGA(BY THIS SCAN):  7-5  weeks  WORKING EDD( early ultrasound ):  06/07/2019     TECHNICIAN COMMENTS:  Patient informed that the ultrasound is considered a limited obstetric ultrasound and is not intended to be a complete ultrasound exam. Patient also informed that the ultrasound is not being completed with the intent of assessing for fetal or placental anomalies or any pelvic abnormalities. Explained that the purpose of today's ultrasound is to assess for fetal heart rate. Patient acknowledges the purpose of the exam and the limitations of the study.    Lindsey Sherman 10/24/2018 10:04 AM
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
## 2018-10-25 LAB — GC/CHLAMYDIA PROBE AMP (~~LOC~~) NOT AT ARMC
Chlamydia: NEGATIVE
Neisseria Gonorrhea: NEGATIVE

## 2018-11-01 LAB — OBSTETRIC PANEL, INCLUDING HIV
Antibody Screen: NEGATIVE
Basophils Absolute: 0 10*3/uL (ref 0.0–0.2)
Basos: 0 %
EOS (ABSOLUTE): 0.1 10*3/uL (ref 0.0–0.4)
Eos: 1 %
HIV Screen 4th Generation wRfx: NONREACTIVE
Hematocrit: 37.8 % (ref 34.0–46.6)
Hemoglobin: 13.2 g/dL (ref 11.1–15.9)
Hepatitis B Surface Ag: NEGATIVE
Immature Grans (Abs): 0 10*3/uL (ref 0.0–0.1)
Immature Granulocytes: 1 %
Lymphocytes Absolute: 1.3 10*3/uL (ref 0.7–3.1)
Lymphs: 22 %
MCH: 30.3 pg (ref 26.6–33.0)
MCHC: 34.9 g/dL (ref 31.5–35.7)
MCV: 87 fL (ref 79–97)
Monocytes Absolute: 0.5 10*3/uL (ref 0.1–0.9)
Monocytes: 9 %
Neutrophils Absolute: 3.9 10*3/uL (ref 1.4–7.0)
Neutrophils: 67 %
Platelets: 248 10*3/uL (ref 150–450)
RBC: 4.36 x10E6/uL (ref 3.77–5.28)
RDW: 12.3 % (ref 11.7–15.4)
RPR Ser Ql: NONREACTIVE
Rh Factor: POSITIVE
Rubella Antibodies, IGG: 1.2 index (ref >0.99)
WBC: 5.7 10*3/uL (ref 3.4–10.8)

## 2018-11-01 LAB — HEMOGLOBINOPATHY EVALUATION
HGB C: 0 %
HGB S: 0 %
HGB VARIANT: 0 %
Hemoglobin A2 Quantitation: 2.5 % (ref 1.8–3.2)
Hemoglobin F Quantitation: 0 % (ref 0.0–2.0)
Hgb A: 97.5 % (ref 96.4–98.8)

## 2018-11-01 LAB — CYSTIC FIBROSIS MUTATION 97: Interpretation: NOT DETECTED

## 2018-11-24 ENCOUNTER — Other Ambulatory Visit: Payer: Self-pay

## 2018-11-24 ENCOUNTER — Ambulatory Visit (INDEPENDENT_AMBULATORY_CARE_PROVIDER_SITE_OTHER): Payer: BC Managed Care – PPO | Admitting: Family Medicine

## 2018-11-24 VITALS — BP 119/75 | HR 72 | Wt 142.0 lb

## 2018-11-24 DIAGNOSIS — Z3A12 12 weeks gestation of pregnancy: Secondary | ICD-10-CM

## 2018-11-24 DIAGNOSIS — Z3401 Encounter for supervision of normal first pregnancy, first trimester: Secondary | ICD-10-CM

## 2018-11-24 NOTE — Addendum Note (Signed)
Addended by: Wendelyn Breslow L on: 11/24/2018 10:50 AM   Modules accepted: Orders
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
## 2018-11-26 LAB — URINE CULTURE, OB REFLEX

## 2018-11-26 LAB — CULTURE, OB URINE

## 2018-12-01 ENCOUNTER — Telehealth: Payer: Self-pay

## 2018-12-01 DIAGNOSIS — Z3401 Encounter for supervision of normal first pregnancy, first trimester: Secondary | ICD-10-CM

## 2018-12-01 NOTE — Telephone Encounter (Signed)
Called regarding Panorama results.Left message for pt to call the office back.  Selassie Spatafore l Naomie Crow, CMA

## 2018-12-01 NOTE — Telephone Encounter (Signed)
Patient called back and made aware that her panorama came back low risk and that she is having a baby boy. Kathrene Alu RN

## 2018-12-22 ENCOUNTER — Other Ambulatory Visit: Payer: Self-pay

## 2018-12-31 ENCOUNTER — Emergency Department (HOSPITAL_BASED_OUTPATIENT_CLINIC_OR_DEPARTMENT_OTHER)
Admission: EM | Admit: 2018-12-31 | Discharge: 2018-12-31 | Disposition: A | Discharge status: Home / Self Care | Payer: BC Managed Care – PPO | Attending: Emergency Medicine | Admitting: Emergency Medicine

## 2018-12-31 ENCOUNTER — Encounter (HOSPITAL_BASED_OUTPATIENT_CLINIC_OR_DEPARTMENT_OTHER): Payer: Self-pay | Admitting: Emergency Medicine

## 2018-12-31 ENCOUNTER — Other Ambulatory Visit: Payer: Self-pay

## 2018-12-31 DIAGNOSIS — O99891 Other specified diseases and conditions complicating pregnancy: Secondary | ICD-10-CM | POA: Diagnosis not present

## 2018-12-31 DIAGNOSIS — Z79899 Other long term (current) drug therapy: Secondary | ICD-10-CM | POA: Diagnosis not present

## 2018-12-31 DIAGNOSIS — H9202 Otalgia, left ear: Secondary | ICD-10-CM | POA: Insufficient documentation

## 2018-12-31 DIAGNOSIS — Z3A17 17 weeks gestation of pregnancy: Secondary | ICD-10-CM | POA: Diagnosis not present

## 2018-12-31 DIAGNOSIS — Z87891 Personal history of nicotine dependence: Secondary | ICD-10-CM | POA: Diagnosis not present

## 2018-12-31 MED ORDER — NEOMYCIN-POLYMYXIN-HC 3.5-10000-1 OT SUSP: 4.0000 [drp] | Freq: Four times a day (QID) | OTIC | 0 refills | Status: AC

## 2018-12-31 NOTE — Discharge Instructions (Signed)
Use the eardrops 4 times daily for 7 days. Follow-up with your primary care provider for any further management of this issue. Acetaminophen: May take acetaminophen (generic for Tylenol), as needed, for pain. Your daily total maximum amount of acetaminophen from all sources should be limited to 4000mg /day for persons without liver problems, or 2000mg /day for those with liver problems.

## 2018-12-31 NOTE — ED Triage Notes (Signed)
Reports left ear ache that started yesterday.  Worse today.  Now radiates into that side of the head.  Taking tylenol without relief.  Patient is [redacted] weeks pregnant.
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
## 2019-01-08 ENCOUNTER — Other Ambulatory Visit: Payer: Self-pay

## 2019-01-08 ENCOUNTER — Other Ambulatory Visit (HOSPITAL_COMMUNITY): Payer: Self-pay | Admitting: *Deleted

## 2019-01-08 ENCOUNTER — Ambulatory Visit (HOSPITAL_COMMUNITY)
Admission: RE | Admit: 2019-01-08 | Discharge: 2019-01-08 | Disposition: A | Discharge status: Home / Self Care | Payer: BC Managed Care – PPO | Source: Ambulatory Visit | Attending: Obstetrics and Gynecology | Admitting: Obstetrics and Gynecology

## 2019-01-08 DIAGNOSIS — Z3401 Encounter for supervision of normal first pregnancy, first trimester: Secondary | ICD-10-CM | POA: Insufficient documentation

## 2019-01-08 DIAGNOSIS — Z3A18 18 weeks gestation of pregnancy: Secondary | ICD-10-CM

## 2019-01-08 DIAGNOSIS — Z362 Encounter for other antenatal screening follow-up: Secondary | ICD-10-CM

## 2019-01-08 DIAGNOSIS — Z363 Encounter for antenatal screening for malformations: Secondary | ICD-10-CM

## 2019-01-12 ENCOUNTER — Emergency Department (HOSPITAL_BASED_OUTPATIENT_CLINIC_OR_DEPARTMENT_OTHER)
Admission: EM | Admit: 2019-01-12 | Discharge: 2019-01-12 | Disposition: A | Discharge status: Home / Self Care | Payer: Medicaid Other | Attending: Emergency Medicine | Admitting: Emergency Medicine

## 2019-01-12 ENCOUNTER — Encounter (HOSPITAL_BASED_OUTPATIENT_CLINIC_OR_DEPARTMENT_OTHER): Payer: Self-pay | Admitting: *Deleted

## 2019-01-12 ENCOUNTER — Other Ambulatory Visit: Payer: Self-pay

## 2019-01-12 DIAGNOSIS — Z5321 Procedure and treatment not carried out due to patient leaving prior to being seen by health care provider: Secondary | ICD-10-CM | POA: Insufficient documentation

## 2019-01-12 DIAGNOSIS — H5712 Ocular pain, left eye: Secondary | ICD-10-CM | POA: Diagnosis present
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
## 2019-01-19 ENCOUNTER — Other Ambulatory Visit: Payer: Self-pay

## 2019-01-19 ENCOUNTER — Ambulatory Visit (INDEPENDENT_AMBULATORY_CARE_PROVIDER_SITE_OTHER): Payer: Self-pay | Admitting: Obstetrics & Gynecology

## 2019-01-19 VITALS — BP 114/71 | HR 72 | Wt 155.1 lb

## 2019-01-19 DIAGNOSIS — Z3401 Encounter for supervision of normal first pregnancy, first trimester: Secondary | ICD-10-CM

## 2019-01-19 DIAGNOSIS — Z3A20 20 weeks gestation of pregnancy: Secondary | ICD-10-CM

## 2019-01-19 DIAGNOSIS — F900 Attention-deficit hyperactivity disorder, predominantly inattentive type: Secondary | ICD-10-CM

## 2019-01-19 DIAGNOSIS — Z7722 Contact with and (suspected) exposure to environmental tobacco smoke (acute) (chronic): Secondary | ICD-10-CM | POA: Insufficient documentation

## 2019-01-19 DIAGNOSIS — O99342 Other mental disorders complicating pregnancy, second trimester: Secondary | ICD-10-CM
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
## 2019-01-19 NOTE — Patient Instructions (Signed)

## 2019-01-22 ENCOUNTER — Other Ambulatory Visit: Payer: Self-pay

## 2019-01-22 NOTE — Progress Notes (Signed)
Patient sent to lab for miss blood work. Kathrene Alu RN

## 2019-01-24 LAB — AFP, SERUM, OPEN SPINA BIFIDA
AFP MoM: 0.72
AFP Value: 48 ng/mL
Gest. Age on Collection Date: 20.6 weeks
Maternal Age At EDD: 26 yr
OSBR Risk 1 IN: 10000
Test Results:: NEGATIVE
Weight: 155 [lb_av]

## 2019-02-05 ENCOUNTER — Ambulatory Visit (HOSPITAL_COMMUNITY)
Admission: RE | Admit: 2019-02-05 | Discharge: 2019-02-05 | Disposition: A | Discharge status: Home / Self Care | Payer: Medicaid Other | Source: Ambulatory Visit | Attending: Obstetrics and Gynecology | Admitting: Obstetrics and Gynecology

## 2019-02-05 ENCOUNTER — Other Ambulatory Visit: Payer: Self-pay

## 2019-02-05 DIAGNOSIS — Z3A22 22 weeks gestation of pregnancy: Secondary | ICD-10-CM

## 2019-02-05 DIAGNOSIS — Z362 Encounter for other antenatal screening follow-up: Secondary | ICD-10-CM | POA: Diagnosis present

## 2019-02-13 ENCOUNTER — Other Ambulatory Visit: Payer: Self-pay

## 2019-02-13 ENCOUNTER — Ambulatory Visit (INDEPENDENT_AMBULATORY_CARE_PROVIDER_SITE_OTHER): Payer: Self-pay | Admitting: Advanced Practice Midwife

## 2019-02-13 ENCOUNTER — Encounter: Payer: Self-pay | Admitting: Advanced Practice Midwife

## 2019-02-13 VITALS — BP 111/68 | HR 74 | Wt 158.0 lb

## 2019-02-13 DIAGNOSIS — Z3490 Encounter for supervision of normal pregnancy, unspecified, unspecified trimester: Secondary | ICD-10-CM

## 2019-02-13 DIAGNOSIS — Z3492 Encounter for supervision of normal pregnancy, unspecified, second trimester: Secondary | ICD-10-CM
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
## 2019-02-13 NOTE — Patient Instructions (Signed)
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
## 2019-03-08 ENCOUNTER — Ambulatory Visit (INDEPENDENT_AMBULATORY_CARE_PROVIDER_SITE_OTHER): Payer: Self-pay | Admitting: Family Medicine

## 2019-03-08 ENCOUNTER — Other Ambulatory Visit: Payer: Self-pay

## 2019-03-08 VITALS — BP 117/67 | HR 77 | Wt 161.0 lb

## 2019-03-08 DIAGNOSIS — Z3401 Encounter for supervision of normal first pregnancy, first trimester: Secondary | ICD-10-CM

## 2019-03-08 DIAGNOSIS — Z3A27 27 weeks gestation of pregnancy: Secondary | ICD-10-CM

## 2019-03-08 DIAGNOSIS — Z3402 Encounter for supervision of normal first pregnancy, second trimester: Secondary | ICD-10-CM

## 2019-03-08 DIAGNOSIS — Z23 Encounter for immunization: Secondary | ICD-10-CM

## 2019-03-08 NOTE — Progress Notes (Signed)
Patient doing her 28 week labs today. Armandina Stammer RN
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
## 2019-03-09 LAB — CBC
Hematocrit: 36 % (ref 34.0–46.6)
Hemoglobin: 12.1 g/dL (ref 11.1–15.9)
MCH: 30.6 pg (ref 26.6–33.0)
MCHC: 33.6 g/dL (ref 31.5–35.7)
MCV: 91 fL (ref 79–97)
Platelets: 159 10*3/uL (ref 150–450)
RBC: 3.96 x10E6/uL (ref 3.77–5.28)
RDW: 12.4 % (ref 11.7–15.4)
WBC: 6.1 10*3/uL (ref 3.4–10.8)

## 2019-03-09 LAB — GLUCOSE TOLERANCE, 2 HOURS W/ 1HR
Glucose, 1 hour: 107 mg/dL (ref 65–179)
Glucose, 2 hour: 93 mg/dL (ref 65–152)
Glucose, Fasting: 81 mg/dL (ref 65–91)

## 2019-03-09 LAB — RPR: RPR Ser Ql: NONREACTIVE

## 2019-03-09 LAB — HIV ANTIBODY (ROUTINE TESTING W REFLEX): HIV Screen 4th Generation wRfx: NONREACTIVE

## 2019-03-22 ENCOUNTER — Other Ambulatory Visit: Payer: Self-pay

## 2019-03-22 ENCOUNTER — Ambulatory Visit (INDEPENDENT_AMBULATORY_CARE_PROVIDER_SITE_OTHER): Payer: Medicaid Other | Admitting: Family Medicine

## 2019-03-22 DIAGNOSIS — Z3401 Encounter for supervision of normal first pregnancy, first trimester: Secondary | ICD-10-CM

## 2019-03-22 DIAGNOSIS — Z3A29 29 weeks gestation of pregnancy: Secondary | ICD-10-CM

## 2019-03-22 DIAGNOSIS — Z3403 Encounter for supervision of normal first pregnancy, third trimester: Secondary | ICD-10-CM
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
## 2019-04-06 ENCOUNTER — Other Ambulatory Visit: Payer: Self-pay

## 2019-04-06 ENCOUNTER — Ambulatory Visit (INDEPENDENT_AMBULATORY_CARE_PROVIDER_SITE_OTHER): Payer: Medicaid Other | Admitting: Family Medicine

## 2019-04-06 VITALS — BP 118/69 | HR 73 | Wt 168.0 lb

## 2019-04-06 DIAGNOSIS — F900 Attention-deficit hyperactivity disorder, predominantly inattentive type: Secondary | ICD-10-CM

## 2019-04-06 DIAGNOSIS — Z3A31 31 weeks gestation of pregnancy: Secondary | ICD-10-CM

## 2019-04-06 DIAGNOSIS — Z3403 Encounter for supervision of normal first pregnancy, third trimester: Secondary | ICD-10-CM

## 2019-04-06 DIAGNOSIS — Z3401 Encounter for supervision of normal first pregnancy, first trimester: Secondary | ICD-10-CM

## 2019-04-06 MED ORDER — FAMOTIDINE 20 MG PO TABS: 20.0000 mg | ORAL_TABLET | Freq: Two times a day (BID) | ORAL | 3 refills | Status: DC
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
## 2019-04-17 ENCOUNTER — Encounter: Payer: Self-pay | Admitting: Advanced Practice Midwife

## 2019-04-17 ENCOUNTER — Other Ambulatory Visit: Payer: Self-pay

## 2019-04-17 ENCOUNTER — Ambulatory Visit (INDEPENDENT_AMBULATORY_CARE_PROVIDER_SITE_OTHER): Payer: Medicaid Other | Admitting: Advanced Practice Midwife

## 2019-04-17 VITALS — BP 118/72 | HR 70 | Wt 167.0 lb

## 2019-04-17 DIAGNOSIS — Z3480 Encounter for supervision of other normal pregnancy, unspecified trimester: Secondary | ICD-10-CM

## 2019-04-17 DIAGNOSIS — Z3483 Encounter for supervision of other normal pregnancy, third trimester: Secondary | ICD-10-CM

## 2019-04-17 DIAGNOSIS — Z3A32 32 weeks gestation of pregnancy: Secondary | ICD-10-CM

## 2019-04-17 NOTE — Patient Instructions (Signed)
Third Trimester of Pregnancy The third trimester is from week 28 through week 40 (months 7 through 9). The third trimester is a time when the unborn baby (fetus) is growing rapidly. At the end of the ninth month, the fetus is about 20 inches in length and weighs 6-10 pounds. Body changes during your third trimester Your body will continue to go through many changes during pregnancy. The changes vary from woman to woman. During the third trimester:  Your weight will continue to increase. You can expect to gain 25-35 pounds (11-16 kg) by the end of the pregnancy.  You may begin to get stretch marks on your hips, abdomen, and breasts.  You may urinate more often because the fetus is moving lower into your pelvis and pressing on your bladder.  You may develop or continue to have heartburn. This is caused by increased hormones that slow down muscles in the digestive tract.  You may develop or continue to have constipation because increased hormones slow digestion and cause the muscles that push waste through your intestines to relax.  You may develop hemorrhoids. These are swollen veins (varicose veins) in the rectum that can itch or be painful.  You may develop swollen, bulging veins (varicose veins) in your legs.  You may have increased body aches in the pelvis, back, or thighs. This is due to weight gain and increased hormones that are relaxing your joints.  You may have changes in your hair. These can include thickening of your hair, rapid growth, and changes in texture. Some women also have hair loss during or after pregnancy, or hair that feels dry or thin. Your hair will most likely return to normal after your baby is born.  Your breasts will continue to grow and they will continue to become tender. A yellow fluid (colostrum) may leak from your breasts. This is the first milk you are producing for your baby.  Your belly button may stick out.  You may notice more swelling in your hands,  face, or ankles.  You may have increased tingling or numbness in your hands, arms, and legs. The skin on your belly may also feel numb.  You may feel short of breath because of your expanding uterus.  You may have more problems sleeping. This can be caused by the size of your belly, increased need to urinate, and an increase in your body's metabolism.  You may notice the fetus "dropping," or moving lower in your abdomen (lightening).  You may have increased vaginal discharge.  You may notice your joints feel loose and you may have pain around your pelvic bone. What to expect at prenatal visits You will have prenatal exams every 2 weeks until week 36. Then you will have weekly prenatal exams. During a routine prenatal visit:  You will be weighed to make sure you and the baby are growing normally.  Your blood pressure will be taken.  Your abdomen will be measured to track your baby's growth.  The fetal heartbeat will be listened to.  Any test results from the previous visit will be discussed.  You may have a cervical check near your due date to see if your cervix has softened or thinned (effaced).  You will be tested for Group B streptococcus. This happens between 35 and 37 weeks. Your health care provider may ask you:  What your birth plan is.  How you are feeling.  If you are feeling the baby move.  If you have had any abnormal   symptoms, such as leaking fluid, bleeding, severe headaches, or abdominal cramping.  If you are using any tobacco products, including cigarettes, chewing tobacco, and electronic cigarettes.  If you have any questions. Other tests or screenings that may be performed during your third trimester include:  Blood tests that check for low iron levels (anemia).  Fetal testing to check the health, activity level, and growth of the fetus. Testing is done if you have certain medical conditions or if there are problems during the pregnancy.  Nonstress test  (NST). This test checks the health of your baby to make sure there are no signs of problems, such as the baby not getting enough oxygen. During this test, a belt is placed around your belly. The baby is made to move, and its heart rate is monitored during movement. What is false labor? False labor is a condition in which you feel small, irregular tightenings of the muscles in the womb (contractions) that usually go away with rest, changing position, or drinking water. These are called Braxton Hicks contractions. Contractions may last for hours, days, or even weeks before true labor sets in. If contractions come at regular intervals, become more frequent, increase in intensity, or become painful, you should see your health care provider. What are the signs of labor?  Abdominal cramps.  Regular contractions that start at 10 minutes apart and become stronger and more frequent with time.  Contractions that start on the top of the uterus and spread down to the lower abdomen and back.  Increased pelvic pressure and dull back pain.  A watery or bloody mucus discharge that comes from the vagina.  Leaking of amniotic fluid. This is also known as your "water breaking." It could be a slow trickle or a gush. Let your health care provider know if it has a color or strange odor. If you have any of these signs, call your health care provider right away, even if it is before your due date. Follow these instructions at home: Medicines  Follow your health care provider's instructions regarding medicine use. Specific medicines may be either safe or unsafe to take during pregnancy.  Take a prenatal vitamin that contains at least 600 micrograms (mcg) of folic acid.  If you develop constipation, try taking a stool softener if your health care provider approves. Eating and drinking   Eat a balanced diet that includes fresh fruits and vegetables, whole grains, good sources of protein such as meat, eggs, or tofu,  and low-fat dairy. Your health care provider will help you determine the amount of weight gain that is right for you.  Avoid raw meat and uncooked cheese. These carry germs that can cause birth defects in the baby.  If you have low calcium intake from food, talk to your health care provider about whether you should take a daily calcium supplement.  Eat four or five small meals rather than three large meals a day.  Limit foods that are high in fat and processed sugars, such as fried and sweet foods.  To prevent constipation: ? Drink enough fluid to keep your urine clear or pale yellow. ? Eat foods that are high in fiber, such as fresh fruits and vegetables, whole grains, and beans. Activity  Exercise only as directed by your health care provider. Most women can continue their usual exercise routine during pregnancy. Try to exercise for 30 minutes at least 5 days a week. Stop exercising if you experience uterine contractions.  Avoid heavy lifting.  Do   not exercise in extreme heat or humidity, or at high altitudes.  Wear low-heel, comfortable shoes.  Practice good posture.  You may continue to have sex unless your health care provider tells you otherwise. Relieving pain and discomfort  Take frequent breaks and rest with your legs elevated if you have leg cramps or low back pain.  Take warm sitz baths to soothe any pain or discomfort caused by hemorrhoids. Use hemorrhoid cream if your health care provider approves.  Wear a good support bra to prevent discomfort from breast tenderness.  If you develop varicose veins: ? Wear support pantyhose or compression stockings as told by your healthcare provider. ? Elevate your feet for 15 minutes, 3-4 times a day. Prenatal care  Write down your questions. Take them to your prenatal visits.  Keep all your prenatal visits as told by your health care provider. This is important. Safety  Wear your seat belt at all times when driving.  Make  a list of emergency phone numbers, including numbers for family, friends, the hospital, and police and fire departments. General instructions  Avoid cat litter boxes and soil used by cats. These carry germs that can cause birth defects in the baby. If you have a cat, ask someone to clean the litter box for you.  Do not travel far distances unless it is absolutely necessary and only with the approval of your health care provider.  Do not use hot tubs, steam rooms, or saunas.  Do not drink alcohol.  Do not use any products that contain nicotine or tobacco, such as cigarettes and e-cigarettes. If you need help quitting, ask your health care provider.  Do not use any medicinal herbs or unprescribed drugs. These chemicals affect the formation and growth of the baby.  Do not douche or use tampons or scented sanitary pads.  Do not cross your legs for long periods of time.  To prepare for the arrival of your baby: ? Take prenatal classes to understand, practice, and ask questions about labor and delivery. ? Make a trial run to the hospital. ? Visit the hospital and tour the maternity area. ? Arrange for maternity or paternity leave through employers. ? Arrange for family and friends to take care of pets while you are in the hospital. ? Purchase a rear-facing car seat and make sure you know how to install it in your car. ? Pack your hospital bag. ? Prepare the baby's nursery. Make sure to remove all pillows and stuffed animals from the baby's crib to prevent suffocation.  Visit your dentist if you have not gone during your pregnancy. Use a soft toothbrush to brush your teeth and be gentle when you floss. Contact a health care provider if:  You are unsure if you are in labor or if your water has broken.  You become dizzy.  You have mild pelvic cramps, pelvic pressure, or nagging pain in your abdominal area.  You have lower back pain.  You have persistent nausea, vomiting, or  diarrhea.  You have an unusual or bad smelling vaginal discharge.  You have pain when you urinate. Get help right away if:  Your water breaks before 37 weeks.  You have regular contractions less than 5 minutes apart before 37 weeks.  You have a fever.  You are leaking fluid from your vagina.  You have spotting or bleeding from your vagina.  You have severe abdominal pain or cramping.  You have rapid weight loss or weight gain.  You have   shortness of breath with chest pain.  You notice sudden or extreme swelling of your face, hands, ankles, feet, or legs.  Your baby makes fewer than 10 movements in 2 hours.  You have severe headaches that do not go away when you take medicine.  You have vision changes. Summary  The third trimester is from week 28 through week 40, months 7 through 9. The third trimester is a time when the unborn baby (fetus) is growing rapidly.  During the third trimester, your discomfort may increase as you and your baby continue to gain weight. You may have abdominal, leg, and back pain, sleeping problems, and an increased need to urinate.  During the third trimester your breasts will keep growing and they will continue to become tender. A yellow fluid (colostrum) may leak from your breasts. This is the first milk you are producing for your baby.  False labor is a condition in which you feel small, irregular tightenings of the muscles in the womb (contractions) that eventually go away. These are called Braxton Hicks contractions. Contractions may last for hours, days, or even weeks before true labor sets in.  Signs of labor can include: abdominal cramps; regular contractions that start at 10 minutes apart and become stronger and more frequent with time; watery or bloody mucus discharge that comes from the vagina; increased pelvic pressure and dull back pain; and leaking of amniotic fluid. This information is not intended to replace advice given to you by your  health care provider. Make sure you discuss any questions you have with your health care provider. Document Revised: 06/08/2018 Document Reviewed: 03/23/2016 Elsevier Patient Education  2020 [employer]  
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
## 2019-04-23 ENCOUNTER — Emergency Department (HOSPITAL_BASED_OUTPATIENT_CLINIC_OR_DEPARTMENT_OTHER)
Admission: EM | Admit: 2019-04-23 | Discharge: 2019-04-23 | Disposition: A | Discharge status: Home / Self Care | Payer: Medicaid Other | Attending: Emergency Medicine | Admitting: Emergency Medicine

## 2019-04-23 ENCOUNTER — Other Ambulatory Visit: Payer: Self-pay

## 2019-04-23 ENCOUNTER — Encounter (HOSPITAL_BASED_OUTPATIENT_CLINIC_OR_DEPARTMENT_OTHER): Payer: Self-pay | Admitting: *Deleted

## 2019-04-23 DIAGNOSIS — Z87891 Personal history of nicotine dependence: Secondary | ICD-10-CM | POA: Insufficient documentation

## 2019-04-23 DIAGNOSIS — N3000 Acute cystitis without hematuria: Secondary | ICD-10-CM | POA: Diagnosis not present

## 2019-04-23 DIAGNOSIS — O26893 Other specified pregnancy related conditions, third trimester: Secondary | ICD-10-CM | POA: Diagnosis present

## 2019-04-23 DIAGNOSIS — Z3A33 33 weeks gestation of pregnancy: Secondary | ICD-10-CM

## 2019-04-23 DIAGNOSIS — Z79899 Other long term (current) drug therapy: Secondary | ICD-10-CM | POA: Insufficient documentation

## 2019-04-23 DIAGNOSIS — R1084 Generalized abdominal pain: Secondary | ICD-10-CM | POA: Insufficient documentation

## 2019-04-23 LAB — CBC WITH DIFFERENTIAL/PLATELET
Abs Immature Granulocytes: 0.11 10*3/uL — ABNORMAL HIGH (ref 0.00–0.07)
Basophils Absolute: 0 10*3/uL (ref 0.0–0.1)
Basophils Relative: 0 %
Eosinophils Absolute: 0.1 10*3/uL (ref 0.0–0.5)
Eosinophils Relative: 1 %
HCT: 36.2 % (ref 36.0–46.0)
Hemoglobin: 12.2 g/dL (ref 12.0–15.0)
Immature Granulocytes: 1 %
Lymphocytes Relative: 15 %
Lymphs Abs: 1.3 10*3/uL (ref 0.7–4.0)
MCH: 30.1 pg (ref 26.0–34.0)
MCHC: 33.7 g/dL (ref 30.0–36.0)
MCV: 89.4 fL (ref 80.0–100.0)
Monocytes Absolute: 0.6 10*3/uL (ref 0.1–1.0)
Monocytes Relative: 7 %
Neutro Abs: 6.6 10*3/uL (ref 1.7–7.7)
Neutrophils Relative %: 76 %
Platelets: 202 10*3/uL (ref 150–400)
RBC: 4.05 MIL/uL (ref 3.87–5.11)
RDW: 12.3 % (ref 11.5–15.5)
WBC: 8.7 10*3/uL (ref 4.0–10.5)
nRBC: 0 % (ref 0.0–0.2)

## 2019-04-23 LAB — BASIC METABOLIC PANEL
Anion gap: 7 (ref 5–15)
BUN: 8 mg/dL (ref 6–20)
CO2: 20 mmol/L — ABNORMAL LOW (ref 22–32)
Calcium: 8.7 mg/dL — ABNORMAL LOW (ref 8.9–10.3)
Chloride: 108 mmol/L (ref 98–111)
Creatinine, Ser: 0.68 mg/dL (ref 0.44–1.00)
GFR calc Af Amer: >60 mL/min (ref >60)
GFR calc non Af Amer: >60 mL/min (ref >60)
Glucose, Bld: 93 mg/dL (ref 70–99)
Potassium: 3.5 mmol/L (ref 3.5–5.1)
Sodium: 135 mmol/L (ref 135–145)

## 2019-04-23 LAB — URINALYSIS, MICROSCOPIC (REFLEX)

## 2019-04-23 LAB — URINALYSIS, ROUTINE W REFLEX MICROSCOPIC
Bilirubin Urine: NEGATIVE
Glucose, UA: 250 mg/dL — AB
Hgb urine dipstick: NEGATIVE
Ketones, ur: NEGATIVE mg/dL
Nitrite: NEGATIVE
Protein, ur: NEGATIVE mg/dL
Specific Gravity, Urine: 1.025 (ref 1.005–1.030)
pH: 6 (ref 5.0–8.0)

## 2019-04-23 MED ORDER — NITROFURANTOIN MONOHYD MACRO 100 MG PO CAPS: 100.0000 mg | ORAL_CAPSULE | Freq: Two times a day (BID) | ORAL | 0 refills | Status: DC

## 2019-04-23 MED ORDER — SODIUM CHLORIDE 0.9 % IV BOLUS
1000.0000 mL | Freq: Once | INTRAVENOUS | Status: AC
  Administered 2019-04-23: 1000 mL via INTRAVENOUS

## 2019-04-23 NOTE — ED Triage Notes (Addendum)
Nasal congestion x 3 weeks. States would like to know what she can take since she is [redacted] weeks pregnant. She also says she has been having intermittent lower abdominal pain since last night. No vaginal leakage. Her OBGYN is located at [facility] 2nd floor. She plans to deliver the baby at [facility].

## 2019-04-23 NOTE — Progress Notes (Signed)
Dr Alysia Penna notified of reactive and reassuring fhr. ED reports closed cervix.  Md also made aware of 2ucs and ui.  MD says pt may dc home.  Sue Lush at [facility] says they are addressing uti.

## 2019-04-23 NOTE — ED Notes (Signed)
ED Provider at bedside.  Pelvic exam

## 2019-04-23 NOTE — ED Notes (Signed)
Placed on totco and rapid  Ob response  constacted ERIn RN

## 2019-04-23 NOTE — Discharge Instructions (Signed)
Take the antibiotics as prescribed.  If develop recurrent abd pain seek care at the [facility] ED [facility] center
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
## 2019-04-30 ENCOUNTER — Ambulatory Visit (INDEPENDENT_AMBULATORY_CARE_PROVIDER_SITE_OTHER): Payer: Medicaid Other | Admitting: Family Medicine

## 2019-04-30 ENCOUNTER — Other Ambulatory Visit: Payer: Self-pay

## 2019-04-30 VITALS — BP 117/72 | HR 76 | Wt 170.1 lb

## 2019-04-30 DIAGNOSIS — O99343 Other mental disorders complicating pregnancy, third trimester: Secondary | ICD-10-CM

## 2019-04-30 DIAGNOSIS — F900 Attention-deficit hyperactivity disorder, predominantly inattentive type: Secondary | ICD-10-CM

## 2019-04-30 DIAGNOSIS — Z7722 Contact with and (suspected) exposure to environmental tobacco smoke (acute) (chronic): Secondary | ICD-10-CM

## 2019-04-30 DIAGNOSIS — Z3480 Encounter for supervision of other normal pregnancy, unspecified trimester: Secondary | ICD-10-CM

## 2019-04-30 DIAGNOSIS — Z3A34 34 weeks gestation of pregnancy: Secondary | ICD-10-CM
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
## 2019-05-04 ENCOUNTER — Encounter: Payer: Medicaid Other | Admitting: Family Medicine

## 2019-05-15 ENCOUNTER — Encounter: Payer: Self-pay | Admitting: Advanced Practice Midwife

## 2019-05-15 ENCOUNTER — Other Ambulatory Visit (HOSPITAL_COMMUNITY)
Admission: RE | Admit: 2019-05-15 | Discharge: 2019-05-15 | Disposition: A | Discharge status: Home / Self Care | Payer: Medicaid Other | Source: Ambulatory Visit | Attending: Advanced Practice Midwife | Admitting: Advanced Practice Midwife

## 2019-05-15 ENCOUNTER — Encounter: Payer: Medicaid Other | Admitting: Advanced Practice Midwife

## 2019-05-15 ENCOUNTER — Ambulatory Visit (INDEPENDENT_AMBULATORY_CARE_PROVIDER_SITE_OTHER): Payer: Medicaid Other | Admitting: Advanced Practice Midwife

## 2019-05-15 ENCOUNTER — Other Ambulatory Visit: Payer: Self-pay

## 2019-05-15 VITALS — BP 124/87 | HR 103 | Wt 173.1 lb

## 2019-05-15 DIAGNOSIS — Z3483 Encounter for supervision of other normal pregnancy, third trimester: Secondary | ICD-10-CM

## 2019-05-15 DIAGNOSIS — Z3480 Encounter for supervision of other normal pregnancy, unspecified trimester: Secondary | ICD-10-CM | POA: Diagnosis present

## 2019-05-15 DIAGNOSIS — T360X5S Adverse effect of penicillins, sequela: Secondary | ICD-10-CM

## 2019-05-15 DIAGNOSIS — Z3A36 36 weeks gestation of pregnancy: Secondary | ICD-10-CM

## 2019-05-15 NOTE — Patient Instructions (Signed)
Third Trimester of Pregnancy The third trimester is from week 28 through week 40 (months 7 through 9). The third trimester is a time when the unborn baby (fetus) is growing rapidly. At the end of the ninth month, the fetus is about 20 inches in length and weighs 6-10 pounds. Body changes during your third trimester Your body will continue to go through many changes during pregnancy. The changes vary from woman to woman. During the third trimester:  Your weight will continue to increase. You can expect to gain 25-35 pounds (11-16 kg) by the end of the pregnancy.  You may begin to get stretch marks on your hips, abdomen, and breasts.  You may urinate more often because the fetus is moving lower into your pelvis and pressing on your bladder.  You may develop or continue to have heartburn. This is caused by increased hormones that slow down muscles in the digestive tract.  You may develop or continue to have constipation because increased hormones slow digestion and cause the muscles that push waste through your intestines to relax.  You may develop hemorrhoids. These are swollen veins (varicose veins) in the rectum that can itch or be painful.  You may develop swollen, bulging veins (varicose veins) in your legs.  You may have increased body aches in the pelvis, back, or thighs. This is due to weight gain and increased hormones that are relaxing your joints.  You may have changes in your hair. These can include thickening of your hair, rapid growth, and changes in texture. Some women also have hair loss during or after pregnancy, or hair that feels dry or thin. Your hair will most likely return to normal after your baby is born.  Your breasts will continue to grow and they will continue to become tender. A yellow fluid (colostrum) may leak from your breasts. This is the first milk you are producing for your baby.  Your belly button may stick out.  You may notice more swelling in your hands,  face, or ankles.  You may have increased tingling or numbness in your hands, arms, and legs. The skin on your belly may also feel numb.  You may feel short of breath because of your expanding uterus.  You may have more problems sleeping. This can be caused by the size of your belly, increased need to urinate, and an increase in your body's metabolism.  You may notice the fetus "dropping," or moving lower in your abdomen (lightening).  You may have increased vaginal discharge.  You may notice your joints feel loose and you may have pain around your pelvic bone. What to expect at prenatal visits You will have prenatal exams every 2 weeks until week 36. Then you will have weekly prenatal exams. During a routine prenatal visit:  You will be weighed to make sure you and the baby are growing normally.  Your blood pressure will be taken.  Your abdomen will be measured to track your baby's growth.  The fetal heartbeat will be listened to.  Any test results from the previous visit will be discussed.  You may have a cervical check near your due date to see if your cervix has softened or thinned (effaced).  You will be tested for Group B streptococcus. This happens between 35 and 37 weeks. Your health care provider may ask you:  What your birth plan is.  How you are feeling.  If you are feeling the baby move.  If you have had any abnormal   symptoms, such as leaking fluid, bleeding, severe headaches, or abdominal cramping.  If you are using any tobacco products, including cigarettes, chewing tobacco, and electronic cigarettes.  If you have any questions. Other tests or screenings that may be performed during your third trimester include:  Blood tests that check for low iron levels (anemia).  Fetal testing to check the health, activity level, and growth of the fetus. Testing is done if you have certain medical conditions or if there are problems during the pregnancy.  Nonstress test  (NST). This test checks the health of your baby to make sure there are no signs of problems, such as the baby not getting enough oxygen. During this test, a belt is placed around your belly. The baby is made to move, and its heart rate is monitored during movement. What is false labor? False labor is a condition in which you feel small, irregular tightenings of the muscles in the womb (contractions) that usually go away with rest, changing position, or drinking water. These are called Braxton Hicks contractions. Contractions may last for hours, days, or even weeks before true labor sets in. If contractions come at regular intervals, become more frequent, increase in intensity, or become painful, you should see your health care provider. What are the signs of labor?  Abdominal cramps.  Regular contractions that start at 10 minutes apart and become stronger and more frequent with time.  Contractions that start on the top of the uterus and spread down to the lower abdomen and back.  Increased pelvic pressure and dull back pain.  A watery or bloody mucus discharge that comes from the vagina.  Leaking of amniotic fluid. This is also known as your "water breaking." It could be a slow trickle or a gush. Let your health care provider know if it has a color or strange odor. If you have any of these signs, call your health care provider right away, even if it is before your due date. Follow these instructions at home: Medicines  Follow your health care provider's instructions regarding medicine use. Specific medicines may be either safe or unsafe to take during pregnancy.  Take a prenatal vitamin that contains at least 600 micrograms (mcg) of folic acid.  If you develop constipation, try taking a stool softener if your health care provider approves. Eating and drinking   Eat a balanced diet that includes fresh fruits and vegetables, whole grains, good sources of protein such as meat, eggs, or tofu,  and low-fat dairy. Your health care provider will help you determine the amount of weight gain that is right for you.  Avoid raw meat and uncooked cheese. These carry germs that can cause birth defects in the baby.  If you have low calcium intake from food, talk to your health care provider about whether you should take a daily calcium supplement.  Eat four or five small meals rather than three large meals a day.  Limit foods that are high in fat and processed sugars, such as fried and sweet foods.  To prevent constipation: ? Drink enough fluid to keep your urine clear or pale yellow. ? Eat foods that are high in fiber, such as fresh fruits and vegetables, whole grains, and beans. Activity  Exercise only as directed by your health care provider. Most women can continue their usual exercise routine during pregnancy. Try to exercise for 30 minutes at least 5 days a week. Stop exercising if you experience uterine contractions.  Avoid heavy lifting.  Do   not exercise in extreme heat or humidity, or at high altitudes.  Wear low-heel, comfortable shoes.  Practice good posture.  You may continue to have sex unless your health care provider tells you otherwise. Relieving pain and discomfort  Take frequent breaks and rest with your legs elevated if you have leg cramps or low back pain.  Take warm sitz baths to soothe any pain or discomfort caused by hemorrhoids. Use hemorrhoid cream if your health care provider approves.  Wear a good support bra to prevent discomfort from breast tenderness.  If you develop varicose veins: ? Wear support pantyhose or compression stockings as told by your healthcare provider. ? Elevate your feet for 15 minutes, 3-4 times a day. Prenatal care  Write down your questions. Take them to your prenatal visits.  Keep all your prenatal visits as told by your health care provider. This is important. Safety  Wear your seat belt at all times when driving.  Make  a list of emergency phone numbers, including numbers for family, friends, the hospital, and police and fire departments. General instructions  Avoid cat litter boxes and soil used by cats. These carry germs that can cause birth defects in the baby. If you have a cat, ask someone to clean the litter box for you.  Do not travel far distances unless it is absolutely necessary and only with the approval of your health care provider.  Do not use hot tubs, steam rooms, or saunas.  Do not drink alcohol.  Do not use any products that contain nicotine or tobacco, such as cigarettes and e-cigarettes. If you need help quitting, ask your health care provider.  Do not use any medicinal herbs or unprescribed drugs. These chemicals affect the formation and growth of the baby.  Do not douche or use tampons or scented sanitary pads.  Do not cross your legs for long periods of time.  To prepare for the arrival of your baby: ? Take prenatal classes to understand, practice, and ask questions about labor and delivery. ? Make a trial run to the hospital. ? Visit the hospital and tour the maternity area. ? Arrange for maternity or paternity leave through employers. ? Arrange for family and friends to take care of pets while you are in the hospital. ? Purchase a rear-facing car seat and make sure you know how to install it in your car. ? Pack your hospital bag. ? Prepare the baby's nursery. Make sure to remove all pillows and stuffed animals from the baby's crib to prevent suffocation.  Visit your dentist if you have not gone during your pregnancy. Use a soft toothbrush to brush your teeth and be gentle when you floss. Contact a health care provider if:  You are unsure if you are in labor or if your water has broken.  You become dizzy.  You have mild pelvic cramps, pelvic pressure, or nagging pain in your abdominal area.  You have lower back pain.  You have persistent nausea, vomiting, or  diarrhea.  You have an unusual or bad smelling vaginal discharge.  You have pain when you urinate. Get help right away if:  Your water breaks before 37 weeks.  You have regular contractions less than 5 minutes apart before 37 weeks.  You have a fever.  You are leaking fluid from your vagina.  You have spotting or bleeding from your vagina.  You have severe abdominal pain or cramping.  You have rapid weight loss or weight gain.  You have   shortness of breath with chest pain.  You notice sudden or extreme swelling of your face, hands, ankles, feet, or legs.  Your baby makes fewer than 10 movements in 2 hours.  You have severe headaches that do not go away when you take medicine.  You have vision changes. Summary  The third trimester is from week 28 through week 40, months 7 through 9. The third trimester is a time when the unborn baby (fetus) is growing rapidly.  During the third trimester, your discomfort may increase as you and your baby continue to gain weight. You may have abdominal, leg, and back pain, sleeping problems, and an increased need to urinate.  During the third trimester your breasts will keep growing and they will continue to become tender. A yellow fluid (colostrum) may leak from your breasts. This is the first milk you are producing for your baby.  False labor is a condition in which you feel small, irregular tightenings of the muscles in the womb (contractions) that eventually go away. These are called Braxton Hicks contractions. Contractions may last for hours, days, or even weeks before true labor sets in.  Signs of labor can include: abdominal cramps; regular contractions that start at 10 minutes apart and become stronger and more frequent with time; watery or bloody mucus discharge that comes from the vagina; increased pelvic pressure and dull back pain; and leaking of amniotic fluid. This information is not intended to replace advice given to you by your  health care provider. Make sure you discuss any questions you have with your health care provider. Document Revised: 06/08/2018 Document Reviewed: 03/23/2016 Elsevier Patient Education  2020 [employer]  
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
## 2019-05-16 LAB — GC/CHLAMYDIA PROBE AMP (~~LOC~~) NOT AT ARMC
Chlamydia: NEGATIVE
Comment: NEGATIVE
Comment: NORMAL
Neisseria Gonorrhea: NEGATIVE

## 2019-05-19 LAB — CULTURE, BETA STREP (GROUP B ONLY): Strep Gp B Culture: NEGATIVE

## 2019-05-24 ENCOUNTER — Other Ambulatory Visit: Payer: Self-pay

## 2019-05-24 ENCOUNTER — Ambulatory Visit (INDEPENDENT_AMBULATORY_CARE_PROVIDER_SITE_OTHER): Payer: Medicaid Other | Admitting: Family Medicine

## 2019-05-24 VITALS — BP 119/76 | HR 70 | Wt 178.0 lb

## 2019-05-24 DIAGNOSIS — Z3403 Encounter for supervision of normal first pregnancy, third trimester: Secondary | ICD-10-CM

## 2019-05-24 DIAGNOSIS — Z3A38 38 weeks gestation of pregnancy: Secondary | ICD-10-CM

## 2019-05-24 DIAGNOSIS — Z3401 Encounter for supervision of normal first pregnancy, first trimester: Secondary | ICD-10-CM

## 2019-05-24 DIAGNOSIS — F900 Attention-deficit hyperactivity disorder, predominantly inattentive type: Secondary | ICD-10-CM
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
## 2019-05-31 ENCOUNTER — Other Ambulatory Visit: Payer: Self-pay

## 2019-05-31 ENCOUNTER — Ambulatory Visit (INDEPENDENT_AMBULATORY_CARE_PROVIDER_SITE_OTHER): Payer: Medicaid Other | Admitting: Family Medicine

## 2019-05-31 VITALS — BP 125/78 | HR 68 | Wt 175.0 lb

## 2019-05-31 DIAGNOSIS — Z3A39 39 weeks gestation of pregnancy: Secondary | ICD-10-CM

## 2019-05-31 DIAGNOSIS — F900 Attention-deficit hyperactivity disorder, predominantly inattentive type: Secondary | ICD-10-CM

## 2019-05-31 DIAGNOSIS — Z3403 Encounter for supervision of normal first pregnancy, third trimester: Secondary | ICD-10-CM

## 2019-05-31 DIAGNOSIS — Z3401 Encounter for supervision of normal first pregnancy, first trimester: Secondary | ICD-10-CM
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
## 2019-06-03 ENCOUNTER — Inpatient Hospital Stay (HOSPITAL_COMMUNITY)
Admission: AD | Admit: 2019-06-03 | Discharge: 2019-06-06 | DRG: 807 | Disposition: A | Discharge status: Home / Self Care | Payer: Medicaid Other | Attending: Family Medicine | Admitting: Family Medicine

## 2019-06-03 ENCOUNTER — Encounter (HOSPITAL_COMMUNITY): Payer: Self-pay | Admitting: Obstetrics & Gynecology

## 2019-06-03 DIAGNOSIS — Z30017 Encounter for initial prescription of implantable subdermal contraceptive: Secondary | ICD-10-CM | POA: Diagnosis not present

## 2019-06-03 DIAGNOSIS — Z975 Presence of (intrauterine) contraceptive device: Secondary | ICD-10-CM

## 2019-06-03 DIAGNOSIS — Z87891 Personal history of nicotine dependence: Secondary | ICD-10-CM

## 2019-06-03 DIAGNOSIS — Z20822 Contact with and (suspected) exposure to covid-19: Secondary | ICD-10-CM | POA: Diagnosis present

## 2019-06-03 DIAGNOSIS — O26893 Other specified pregnancy related conditions, third trimester: Secondary | ICD-10-CM | POA: Diagnosis present

## 2019-06-03 DIAGNOSIS — Z7722 Contact with and (suspected) exposure to environmental tobacco smoke (acute) (chronic): Secondary | ICD-10-CM

## 2019-06-03 DIAGNOSIS — O4202 Full-term premature rupture of membranes, onset of labor within 24 hours of rupture: Secondary | ICD-10-CM | POA: Diagnosis not present

## 2019-06-03 DIAGNOSIS — Z3A39 39 weeks gestation of pregnancy: Secondary | ICD-10-CM

## 2019-06-03 DIAGNOSIS — Z3046 Encounter for surveillance of implantable subdermal contraceptive: Secondary | ICD-10-CM | POA: Diagnosis not present

## 2019-06-03 DIAGNOSIS — O9962 Diseases of the digestive system complicating childbirth: Principal | ICD-10-CM | POA: Diagnosis present

## 2019-06-03 DIAGNOSIS — R12 Heartburn: Secondary | ICD-10-CM | POA: Diagnosis present

## 2019-06-03 DIAGNOSIS — Z3401 Encounter for supervision of normal first pregnancy, first trimester: Secondary | ICD-10-CM

## 2019-06-03 HISTORY — DX: Anxiety disorder, unspecified: F41.9

## 2019-06-03 LAB — CBC
HCT: 40.6 % (ref 36.0–46.0)
Hemoglobin: 13.2 g/dL (ref 12.0–15.0)
MCH: 28.9 pg (ref 26.0–34.0)
MCHC: 32.5 g/dL (ref 30.0–36.0)
MCV: 89 fL (ref 80.0–100.0)
Platelets: 199 10*3/uL (ref 150–400)
RBC: 4.56 MIL/uL (ref 3.87–5.11)
RDW: 13 % (ref 11.5–15.5)
WBC: 9.2 10*3/uL (ref 4.0–10.5)
nRBC: 0 % (ref 0.0–0.2)

## 2019-06-03 LAB — SARS CORONAVIRUS 2 (TAT 6-24 HRS): SARS Coronavirus 2: NEGATIVE

## 2019-06-03 LAB — RPR: RPR Ser Ql: NONREACTIVE

## 2019-06-03 LAB — ABO/RH: ABO/RH(D): O POS

## 2019-06-03 LAB — TYPE AND SCREEN
ABO/RH(D): O POS
Antibody Screen: NEGATIVE

## 2019-06-03 MED ORDER — LIDOCAINE HCL (PF) 1 % IJ SOLN: 30.0000 mL | INTRAMUSCULAR | Status: DC | PRN

## 2019-06-03 MED ORDER — ONDANSETRON HCL 4 MG/2ML IJ SOLN
4.0000 mg | Freq: Four times a day (QID) | INTRAMUSCULAR | Status: DC | PRN
  Administered 2019-06-03: 4 mg via INTRAVENOUS
  Filled 2019-06-03: qty 2

## 2019-06-03 MED ORDER — FAMOTIDINE IN NACL 20-0.9 MG/50ML-% IV SOLN
20.0000 mg | Freq: Once | INTRAVENOUS | Status: AC
  Administered 2019-06-03: 15:00:00 20 mg via INTRAVENOUS
  Filled 2019-06-03: qty 50

## 2019-06-03 MED ORDER — EPHEDRINE 5 MG/ML INJ: 10.0000 mg | INTRAVENOUS | Status: DC | PRN

## 2019-06-03 MED ORDER — SOD CITRATE-CITRIC ACID 500-334 MG/5ML PO SOLN: 30.0000 mL | ORAL | Status: DC | PRN

## 2019-06-03 MED ORDER — PHENYLEPHRINE 40 MCG/ML (10ML) SYRINGE FOR IV PUSH (FOR BLOOD PRESSURE SUPPORT): 80.0000 ug | PREFILLED_SYRINGE | INTRAVENOUS | Status: DC | PRN

## 2019-06-03 MED ORDER — OXYCODONE-ACETAMINOPHEN 5-325 MG PO TABS
2.0000 | ORAL_TABLET | ORAL | Status: DC | PRN
  Administered 2019-06-04: 2 via ORAL
  Filled 2019-06-03: qty 2

## 2019-06-03 MED ORDER — BUTORPHANOL TARTRATE 1 MG/ML IJ SOLN
INTRAMUSCULAR | Status: AC
  Filled 2019-06-03: qty 1

## 2019-06-03 MED ORDER — FENTANYL CITRATE (PF) 100 MCG/2ML IJ SOLN
100.0000 ug | INTRAMUSCULAR | Status: DC | PRN
  Administered 2019-06-03 (×11): 100 ug via INTRAVENOUS
  Filled 2019-06-03 (×10): qty 2

## 2019-06-03 MED ORDER — SODIUM CHLORIDE 0.9 % IV SOLN: 40.0000 mg | Freq: Once | INTRAVENOUS | Status: DC

## 2019-06-03 MED ORDER — ACETAMINOPHEN 325 MG PO TABS: 650.0000 mg | ORAL_TABLET | ORAL | Status: DC | PRN

## 2019-06-03 MED ORDER — OXYTOCIN 40 UNITS IN NORMAL SALINE INFUSION - SIMPLE MED
2.5000 [IU]/h | INTRAVENOUS | Status: DC
  Filled 2019-06-03: qty 1000

## 2019-06-03 MED ORDER — OXYTOCIN 40 UNITS IN NORMAL SALINE INFUSION - SIMPLE MED
1.0000 m[IU]/min | INTRAVENOUS | Status: DC
  Administered 2019-06-03: 2 m[IU]/min via INTRAVENOUS

## 2019-06-03 MED ORDER — PROMETHAZINE HCL 25 MG/ML IJ SOLN
25.0000 mg | Freq: Once | INTRAMUSCULAR | Status: AC
  Administered 2019-06-03: 25 mg via INTRAVENOUS
  Filled 2019-06-03: qty 1

## 2019-06-03 MED ORDER — DIPHENHYDRAMINE HCL 50 MG/ML IJ SOLN: 12.5000 mg | INTRAMUSCULAR | Status: DC | PRN

## 2019-06-03 MED ORDER — FLEET ENEMA 7-19 GM/118ML RE ENEM: 1.0000 | ENEMA | RECTAL | Status: DC | PRN

## 2019-06-03 MED ORDER — FENTANYL-BUPIVACAINE-NACL 0.5-0.125-0.9 MG/250ML-% EP SOLN: 12.0000 mL/h | EPIDURAL | Status: DC | PRN

## 2019-06-03 MED ORDER — OXYCODONE-ACETAMINOPHEN 5-325 MG PO TABS: 1.0000 | ORAL_TABLET | ORAL | Status: DC | PRN

## 2019-06-03 MED ORDER — LACTATED RINGERS IV SOLN: INTRAVENOUS | Status: DC

## 2019-06-03 MED ORDER — OXYTOCIN BOLUS FROM INFUSION
500.0000 mL | Freq: Once | INTRAVENOUS | Status: AC
  Administered 2019-06-04: 04:00:00 500 mL via INTRAVENOUS

## 2019-06-03 MED ORDER — BUTORPHANOL TARTRATE 1 MG/ML IJ SOLN
1.0000 mg | Freq: Once | INTRAMUSCULAR | Status: AC
  Administered 2019-06-03: 1 mg via INTRAVENOUS

## 2019-06-03 MED ORDER — TERBUTALINE SULFATE 1 MG/ML IJ SOLN: 0.2500 mg | Freq: Once | INTRAMUSCULAR | Status: DC | PRN

## 2019-06-03 MED ORDER — LACTATED RINGERS IV SOLN: 500.0000 mL | INTRAVENOUS | Status: DC | PRN

## 2019-06-03 MED ORDER — LACTATED RINGERS IV SOLN: 500.0000 mL | Freq: Once | INTRAVENOUS | Status: DC

## 2019-06-03 NOTE — Progress Notes (Signed)
Vertex presentation confirmed via bedside ultrasound.  Clayton Bibles, MSN, CNM Certified Nurse Midwife, Faculty Practice 06/03/19 3:21 AM

## 2019-06-03 NOTE — Progress Notes (Signed)
Lindsey Sherman is a 24 y.o. G1P0 at [redacted]w[redacted]d admitted for PROM  Subjective:  Contracting some and a lot of general discomfort with contractions. Has been getting fentanyl for pain.  Objective: BP (!) 139/93   Pulse 77   Temp 97.6 F (36.4 C) (Oral)   Resp 20   LMP 08/20/2018 (Approximate)  No intake/output data recorded. No intake/output data recorded.  FHT:  FHR: 130 bpm, variability: moderate,  accelerations:  Present,  decelerations:  Absent UC:   regular, every 3-4 minutes SVE:   Dilation: 2 Effacement (%): 80 Station: -2 Exam by:: Zorita Pang CNM   Procedure: Patient informed of R/B/A of procedure. NST was performed and was reactive prior to procedure. NST:  EFM: Baseline: 130 bpm, Variability: Good {> 6 bpm), Accelerations: Reactive and Decelerations: Absent Toco: regular, every 3-4 minutes Appropriate time out taken. The patient was placed in the lithotomy position and a cervical exam was performed and a finger was used to guide the 16 F Cookes cath balloon through the internal os of the cervix. Cooke Uterine Balloon filled with 60cc of sterile water.  Cooke placed on tension and taped to medial thigh.  NST:  EFM Baseline: 130 bpm, Variability: Good {> 6 bpm), Accelerations: Reactive and Decelerations: Absent  Toco: regular, every 3-4 minutes There were no signs of tachysystole or hypertonus. All equipment was removed and accounted for. The patient tolerated the procedure well.     Labs: Lab Results  Component Value Date   WBC 9.2 06/03/2019   HGB 13.2 06/03/2019   HCT 40.6 06/03/2019   MCV 89.0 06/03/2019   PLT 199 06/03/2019    Assessment / Plan: Protracted latent phase  Labor: FB placed and will start pitocin  Preeclampsia:  NA Fetal Wellbeing:  Category I Pain Control:  IV pain meds I/D:  n/a Anticipated MOD:  NSVD  Thressa Sheller DNP, CNM  06/03/19  4:35 PM

## 2019-06-03 NOTE — Progress Notes (Addendum)
Labor Progress Note Lindsey Sherman is a 24 y.o. G1P0 at [redacted]w[redacted]d presented for SROM around 0230 today.    S: Patient experiencing pain with contractions.   O:  BP 133/87   Pulse 73   Temp 97.8 F (36.6 C) (Oral)   Resp 16   LMP 08/20/2018 (Approximate)  EFM: 130s/moderate variability / no accels, no decels   CVE: Dilation: 3 Effacement (%): 70 Station: -3 Presentation: Vertex Exam by:: Devoria Albe, RN    A&P: 24 y.o. G1P0 [redacted]w[redacted]d with SROM at home.   #Labor: Progressing. Consider starting pitocin.  #Pain: Fentanyl per request. Doesn't want epidural  #FWB: cat 1 #GBS negative   Stellar Gensel, DO 10:05 AM

## 2019-06-03 NOTE — Progress Notes (Signed)
fhr not tracing due to maternal position
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
## 2019-06-03 NOTE — Progress Notes (Signed)
Lindsey Sherman is a 24 y.o. G1P0 at [redacted]w[redacted]d admitted for PROM  Subjective: Comfortable while on nitrous.      Objective: BP 131/73   Pulse 70   Temp 97.6 F (36.4 C) (Oral)   Resp 20   LMP 08/20/2018 (Approximate)  No intake/output data recorded. No intake/output data recorded.  FHT:  FHR: 120s bpm, variability: minimal ,  accelerations:  Present,  decelerations:  Absent UC:   regular, every 3-4 minutes SVE:   Dilation: 2 Effacement (%): 80 Station: -2 Exam by:: Zorita Pang CNM     Assessment / Plan: Protracted latent phase  Labor: FB in place.  Will start pitocin.  Preeclampsia:  NA Fetal Wellbeing:  Category I Pain Control:  IV pain meds , nitrous  I/D:  n/a Anticipated MOD:  NSVD  Katha Cabal ,  DO 06/03/19  7:20 PM

## 2019-06-03 NOTE — MAU Note (Signed)
SROM at 0230-clear fluid.  Contractions 3 mins apart.  Denies complications w/ pregnancy.  Denies VB.  + FM

## 2019-06-04 ENCOUNTER — Encounter (HOSPITAL_COMMUNITY): Payer: Self-pay | Admitting: Family Medicine

## 2019-06-04 DIAGNOSIS — Z3046 Encounter for surveillance of implantable subdermal contraceptive: Secondary | ICD-10-CM

## 2019-06-04 DIAGNOSIS — Z3A39 39 weeks gestation of pregnancy: Secondary | ICD-10-CM

## 2019-06-04 LAB — CBC
HCT: 39.8 % (ref 36.0–46.0)
Hemoglobin: 12.9 g/dL (ref 12.0–15.0)
MCH: 29.1 pg (ref 26.0–34.0)
MCHC: 32.4 g/dL (ref 30.0–36.0)
MCV: 89.8 fL (ref 80.0–100.0)
Platelets: 188 10*3/uL (ref 150–400)
RBC: 4.43 MIL/uL (ref 3.87–5.11)
RDW: 13.1 % (ref 11.5–15.5)
WBC: 22.9 10*3/uL — ABNORMAL HIGH (ref 4.0–10.5)
nRBC: 0 % (ref 0.0–0.2)

## 2019-06-04 LAB — COMPREHENSIVE METABOLIC PANEL
ALT: 22 U/L (ref 0–44)
AST: 32 U/L (ref 15–41)
Albumin: 2.8 g/dL — ABNORMAL LOW (ref 3.5–5.0)
Alkaline Phosphatase: 167 U/L — ABNORMAL HIGH (ref 38–126)
Anion gap: 10 (ref 5–15)
BUN: 9 mg/dL (ref 6–20)
CO2: 17 mmol/L — ABNORMAL LOW (ref 22–32)
Calcium: 8.3 mg/dL — ABNORMAL LOW (ref 8.9–10.3)
Chloride: 106 mmol/L (ref 98–111)
Creatinine, Ser: 1.07 mg/dL — ABNORMAL HIGH (ref 0.44–1.00)
GFR calc Af Amer: >60 mL/min (ref >60)
GFR calc non Af Amer: >60 mL/min (ref >60)
Glucose, Bld: 113 mg/dL — ABNORMAL HIGH (ref 70–99)
Potassium: 4.2 mmol/L (ref 3.5–5.1)
Sodium: 133 mmol/L — ABNORMAL LOW (ref 135–145)
Total Bilirubin: 1.5 mg/dL — ABNORMAL HIGH (ref 0.3–1.2)
Total Protein: 6.1 g/dL — ABNORMAL LOW (ref 6.5–8.1)

## 2019-06-04 MED ORDER — ACETAMINOPHEN 325 MG PO TABS
650.0000 mg | ORAL_TABLET | ORAL | Status: DC | PRN
  Administered 2019-06-05: 10:00:00 650 mg via ORAL
  Filled 2019-06-04: qty 2

## 2019-06-04 MED ORDER — ONDANSETRON HCL 4 MG/2ML IJ SOLN: 4.0000 mg | INTRAMUSCULAR | Status: DC | PRN

## 2019-06-04 MED ORDER — LIDOCAINE HCL 1 % IJ SOLN
0.0000 mL | Freq: Once | INTRAMUSCULAR | Status: AC | PRN
  Administered 2019-06-04: 17:00:00 3 mL via INTRADERMAL
  Filled 2019-06-04: qty 20

## 2019-06-04 MED ORDER — WITCH HAZEL-GLYCERIN EX PADS: 1.0000 "application " | MEDICATED_PAD | CUTANEOUS | Status: DC | PRN

## 2019-06-04 MED ORDER — IBUPROFEN 600 MG PO TABS
600.0000 mg | ORAL_TABLET | Freq: Four times a day (QID) | ORAL | Status: DC
  Administered 2019-06-04 – 2019-06-06 (×9): 600 mg via ORAL
  Filled 2019-06-04 (×10): qty 1

## 2019-06-04 MED ORDER — BENZOCAINE-MENTHOL 20-0.5 % EX AERO
1.0000 "application " | INHALATION_SPRAY | CUTANEOUS | Status: DC | PRN
  Administered 2019-06-04: 1 via TOPICAL
  Filled 2019-06-04: qty 56

## 2019-06-04 MED ORDER — COCONUT OIL OIL
1.0000 "application " | TOPICAL_OIL | Status: DC | PRN
  Administered 2019-06-06: 1 via TOPICAL

## 2019-06-04 MED ORDER — TETANUS-DIPHTH-ACELL PERTUSSIS 5-2.5-18.5 LF-MCG/0.5 IM SUSP: 0.5000 mL | Freq: Once | INTRAMUSCULAR | Status: DC

## 2019-06-04 MED ORDER — DIPHENHYDRAMINE HCL 25 MG PO CAPS: 25.0000 mg | ORAL_CAPSULE | Freq: Four times a day (QID) | ORAL | Status: DC | PRN

## 2019-06-04 MED ORDER — DIBUCAINE (PERIANAL) 1 % EX OINT: 1.0000 "application " | TOPICAL_OINTMENT | CUTANEOUS | Status: DC | PRN

## 2019-06-04 MED ORDER — ZOLPIDEM TARTRATE 5 MG PO TABS: 5.0000 mg | ORAL_TABLET | Freq: Every evening | ORAL | Status: DC | PRN

## 2019-06-04 MED ORDER — SENNOSIDES-DOCUSATE SODIUM 8.6-50 MG PO TABS
2.0000 | ORAL_TABLET | ORAL | Status: DC
  Administered 2019-06-04: 23:00:00 2 via ORAL
  Filled 2019-06-04 (×2): qty 2

## 2019-06-04 MED ORDER — SIMETHICONE 80 MG PO CHEW: 80.0000 mg | CHEWABLE_TABLET | ORAL | Status: DC | PRN

## 2019-06-04 MED ORDER — ONDANSETRON HCL 4 MG PO TABS: 4.0000 mg | ORAL_TABLET | ORAL | Status: DC | PRN

## 2019-06-04 MED ORDER — ETONOGESTREL 68 MG ~~LOC~~ IMPL
68.0000 mg | DRUG_IMPLANT | Freq: Once | SUBCUTANEOUS | Status: AC
  Administered 2019-06-04: 68 mg via SUBCUTANEOUS
  Filled 2019-06-04: qty 1

## 2019-06-04 MED ORDER — PRENATAL MULTIVITAMIN CH
1.0000 | ORAL_TABLET | Freq: Every day | ORAL | Status: DC
  Administered 2019-06-04 – 2019-06-05 (×2): 1 via ORAL
  Filled 2019-06-04 (×3): qty 1

## 2019-06-04 NOTE — Plan of Care (Signed)
  Problem: Clinical Measurements: Goal: Ability to maintain clinical measurements within normal limits will improve Outcome: Progressing   Problem: Education: Goal: Knowledge of condition will improve Outcome: Progressing   Problem: Life Cycle: Goal: Chance of risk for complications during the postpartum period will decrease Outcome: Progressing Note: Fundas firm, lochia WNL & without clots.   Problem: Role Relationship: Goal: Ability to demonstrate positive interaction with newborn will improve Outcome: Progressing

## 2019-06-04 NOTE — Discharge Summary (Signed)
Postpartum Discharge Summary    Patient Name: Lindsey Sherman DOB: 1995-09-29 MRN: [id number]  Date of admission: 06/03/2019 Delivering Provider: Wende Mott   Date of discharge: 06/06/2019  Admitting diagnosis: Indication for care in labor or delivery [O75.9] Intrauterine pregnancy: [redacted]w[redacted]d    Secondary diagnosis:  Active Problems:   Encounter for supervision of normal first pregnancy in first trimester   Indication for care in labor or delivery   Nexplanon in place  Additional problems:      Discharge diagnosis: Term Pregnancy Delivered                                                                                                Post partum procedures:Nexplanon insertion  Augmentation: Pitocin and Foley Balloon  Complications: None  Hospital course:  Onset of Labor With Vaginal Delivery     24y.o. yo G1P0 at 36w4das admitted in Latent Labor on 06/03/2019. Patient had an uncomplicated labor course as follows:  Membrane Rupture Time/Date: 2:30 AM ,06/03/2019   Intrapartum Procedures: Episiotomy: None [1]                                         Lacerations:  None [1]  Patient had a delivery of a Viable infant. 06/04/2019  Information for the patient's newborn:  AlIrean, Kendrickspril [[id number]]Delivery Method: Vaginal, Spontaneous(Filed from Delivery Summary)     Pateint had an uncomplicated postpartum course.  She is ambulating, tolerating a regular diet, passing flatus, and urinating well. Patient is discharged home in stable condition on 06/06/19.  Delivery time: 3:43 AM    Magnesium Sulfate received: No BMZ received: No Rhophylac:N/A MMR:N/A Transfusion:No  Physical exam  Vitals:   06/05/19 0530 06/05/19 1603 06/05/19 2153 06/06/19 0525  BP: 111/67 118/64 (!) 99/59 (!) 120/94  Pulse: 67 69 63 69  Resp: '16 18 18 18  ' Temp: 97.6 F (36.4 C) 98.3 F (36.8 C) (!) 97.4 F (36.3 C) 97.9 F (36.6 C)  TempSrc: Axillary Oral Oral Axillary  SpO2:  99% 99% 100%    General: alert, cooperative and no distress Lochia: appropriate Uterine Fundus: firm Incision: N/A DVT Evaluation: No evidence of DVT seen on physical exam. No significant calf/ankle edema. Labs: Lab Results  Component Value Date   WBC 22.9 (H) 06/04/2019   HGB 12.9 06/04/2019   HCT 39.8 06/04/2019   MCV 89.8 06/04/2019   PLT 188 06/04/2019   CMP Latest Ref Rng & Units 06/04/2019  Glucose 70 - 99 mg/dL 113(H)  BUN 6 - 20 mg/dL 9  Creatinine 0.44 - 1.00 mg/dL 1.07(H)  Sodium 135 - 145 mmol/L 133(L)  Potassium 3.5 - 5.1 mmol/L 4.2  Chloride 98 - 111 mmol/L 106  CO2 22 - 32 mmol/L 17(L)  Calcium 8.9 - 10.3 mg/dL 8.3(L)  Total Protein 6.5 - 8.1 g/dL 6.1(L)  Total Bilirubin 0.3 - 1.2 mg/dL 1.5(H)  Alkaline Phos 38 - 126 U/L 167(H)  AST 15 - 41 U/L 32  ALT 0 -  44 U/L 22   Edinburgh Score: Edinburgh Postnatal Depression Scale Screening Tool 06/05/2019  I have been able to laugh and see the funny side of things. (No Data)    Discharge instruction: per After Visit Summary and "Baby and Me Booklet".  After visit meds:  Allergies as of 06/06/2019      Reactions   Penicillin G Hives   Other reaction(s): Vomiting   Penicillins    Amoxicillin Nausea And Vomiting      Medication List    STOP taking these medications   Diclegis 10-10 MG Tbec Generic drug: Doxylamine-Pyridoxine   potassium chloride 10 MEQ tablet Commonly known as: KLOR-CON     TAKE these medications   acetaminophen 325 MG tablet Commonly known as: Tylenol Take 2 tablets (650 mg total) by mouth every 4 (four) hours as needed (for pain scale < 4).   cetirizine 10 MG tablet Commonly known as: ZYRTEC Take by mouth.   famotidine 20 MG tablet Commonly known as: PEPCID Take 1 tablet (20 mg total) by mouth 2 (two) times daily.   fluticasone 50 MCG/ACT nasal spray Commonly known as: FLONASE Place into the nose.   ibuprofen 600 MG tablet Commonly known as: ADVIL Take 1 tablet (600 mg total) by mouth  every 6 (six) hours.   metoCLOPramide 10 MG tablet Commonly known as: REGLAN Take 10 mg by mouth every 6 (six) hours as needed for nausea or vomiting.   polyethylene glycol powder 17 GM/SCOOP powder Commonly known as: GLYCOLAX/MIRALAX Take 17 g by mouth daily as needed for moderate constipation.   Prenatal Vitamin 27-0.8 MG Tabs Take 1 tablet by mouth daily.   promethazine 25 MG tablet Commonly known as: PHENERGAN Take by mouth.       Diet: routine diet  Activity: Advance as tolerated. Pelvic rest for 6 weeks.   Outpatient follow up:6 weeks Follow up Appt: Future Appointments  Date Time Provider [place]  06/07/2019  8:45 AM Lavonia Drafts, MD [facility] None  06/12/2019  9:30 AM [facility] None   Follow up Visit:  Please schedule this patient for Postpartum visit in: 4 weeks with the following provider: Any provider Virtual For C/S patients schedule nurse incision check in weeks 2 weeks: no Low risk pregnancy complicated by: n/a Delivery mode:  SVD Anticipated Birth Control:  Nexplanon PP Procedures needed: n/a  Schedule Integrated Ryder visit: no    Newborn Data: Live born female  Birth Weight:  3020 grams APGAR: 44, 9  Newborn Delivery   Birth date/time: 06/04/2019 03:43:00 Delivery type: Vaginal, Spontaneous      Baby Feeding: Bottle and Breast Disposition:home with mother

## 2019-06-04 NOTE — Progress Notes (Signed)
Labor Progress Note  Lindsey Sherman is a 24 y.o. G1P0 at [redacted]w[redacted]d  admitted for active labor, rupture of membranes  S: pt sitting on yoga ball in visible discomfort.  Does not have any questions for [place] at this time.  Pt asked for some iv pain medication.   O:  BP (!) 137/95   Pulse 82   Temp 98.5 F (36.9 C) (Oral)   Resp 16   LMP 08/20/2018 (Approximate)   FHT:  FHR: 140 bpm, variability: moderate,  accelerations:  Present,  decelerations:  Present earlies UC:   regular, every 1-2 minutes SVE:   Dilation: 4 Effacement (%): 70 Station: -1 Exam by:: Loletha Carrow, RN SROM/AROM: SROM @ 0230, 06/03/19  Pitocin @ 6 mu/min  Labs: Lab Results  Component Value Date   WBC 9.2 06/03/2019   HGB 13.2 06/03/2019   HCT 40.6 06/03/2019   MCV 89.0 06/03/2019   PLT 199 06/03/2019    Assessment / Plan: 24 y.o. G1P0 [redacted]w[redacted]d w/ ROM/SOL in early labor.  S/p foley bulb.  Currently on pitocin 25mU/m Spontaneous labor, progressing normally  Labor: Progressing normally with pitocin Fetal Wellbeing:  Category I Pain Control:  IV pain meds. Stadol.   Anticipated MOD:  NSVD  Expectant management   @SIGN @

## 2019-06-04 NOTE — Procedures (Signed)
Post-Placental Nexplanon Insertion Procedure Note  Patient was identified. Informed consent was signed, signed copy in chart. A time-out was performed.    The insertion site was identified 8-10 cm (3-4 inches) from the medial epicondyle of the humerus and 3-5 cm (1.25-2 inches) posterior to (below) the sulcus (groove) between the biceps and triceps muscles of the patient's left arm and marked. The site was prepped and draped in the usual sterile fashion. Pt was prepped with alcohol swab and then injected with 3 cc of 1 % lidocaine. The site was prepped with betadine. Nexplanon removed form packaging,  Device confirmed in needle, then inserted full length of needle and withdrawn per handbook instructions. Provider and patient verified presence of the implant in the woman's arm by palpation. Pt insertion site was covered with steristrips/adhesive bandage and pressure bandage. There was minimal blood loss. Patient tolerated procedure well.  Patient was given post procedure instructions and Nexplanon user card with expiration date. Condoms were recommended for STI prevention. Patient was asked to keep the pressure dressing on for 24 hours to minimize bruising and keep the adhesive bandage on for 3-5 days. The patient verbalized understanding of the plan of care and agrees.   Lot #[id number] Expiration Date 2023 Jul 01

## 2019-06-04 NOTE — Lactation Note (Signed)
This note was copied from a baby's chart. Lactation Consultation Note  Patient Name: Boy Petronella Maudlin Today's Date: 06/04/2019 Reason for consult: Initial assessment;Term;Primapara;1st time breastfeeding  P1 mother whose infant is now 64 hours old.  This is a term baby at 39+4 weeks.  Baby was awake when I arrived; offered to assist with latching and mother agreeable.  Asked mother to demonstrated hand expression.  With a few revisions with her technique she was able to produce a few drops of colostrum which I finger fed back to baby.  He has a tight suck and I performed some suck training to help relax him.  Assisted to latch easily in the cross cradle position on the right breast.  Mother's breasts are large, soft and non tender and nipples are large, everted and intact.  Baby had a wide gape and flanged lips with latching.  He began to take a few sucks but was not interested in continuing.  He pulled back off the breast.  Repeated latching a few more times and he latched easily.  Demonstrated gentle stimulation and breast compressions but he was still not interested.  He remained awake and very calm.  Placed him STS on mother's chest and he quietly gazed up at his mother.  He was very peaceful.  Reassured mother that this is typical behavior for a baby at this age.  Praised her efforts and was happy to see him bonding with his mother.  Encouraged to feed on cue or at least 8-12 times/24 hours.  Reviewed feeding cues with mother.  Colostrum container provided and milk storage times discussed.  Mother does not have a DEBP for home use.  She is on Medicaid and I suggested she call the [facility] office today to discuss applying.  Mother is very interested in doing this and will call today.    Mom made aware of O/P services, breastfeeding support groups, community resources, and our phone # for post-discharge questions. No support person here at this time, however, mother was talking to another female on her  phone when I arrived.  RN updated.   Maternal Data Formula Feeding for Exclusion: No Has patient been taught Hand Expression?: Yes Does the patient have breastfeeding experience prior to this delivery?: No  Feeding Feeding Type: Breast Fed  LATCH Score Latch: Grasps breast easily, tongue down, lips flanged, rhythmical sucking.  Audible Swallowing: None  Type of Nipple: Everted at rest and after stimulation  Comfort (Breast/Nipple): Soft / non-tender  Hold (Positioning): Assistance needed to correctly position infant at breast and maintain latch.  LATCH Score: 7  Interventions Interventions: Breast feeding basics reviewed;Assisted with latch;Skin to skin;Breast massage;Hand express;Breast compression;Adjust position;Position options;Support pillows  Lactation Tools Discussed/Used WIC Program: No(Encouraged to call the [facility] office today)   Consult Status Consult Status: Follow-up Date: 06/05/19 Follow-up type: In-patient    Vonna Brabson R Sharah Finnell 06/04/2019, 1:03 PM

## 2019-06-05 MED ORDER — OXYCODONE HCL 5 MG PO TABS
5.0000 mg | ORAL_TABLET | Freq: Once | ORAL | Status: AC
  Administered 2019-06-05: 12:00:00 5 mg via ORAL
  Filled 2019-06-05: qty 1

## 2019-06-05 NOTE — Progress Notes (Signed)
POSTPARTUM PROGRESS NOTE  Subjective: Lindsey Sherman is a 24 y.o. G1P1001 s/p vaginal delivery at [redacted]w[redacted]d.  She reports she doing well. No acute events overnight. She denies any problems with ambulating, voiding or po intake. Denies nausea or vomiting. She has passed flatus. Pain is moderately controlled.  Lochia is appropriate.  Objective: Blood pressure 111/67, pulse 67, temperature 97.6 F (36.4 C), temperature source Axillary, resp. rate 16, last menstrual period 08/20/2018, SpO2 100 %, unknown if currently breastfeeding.  Physical Exam:  General: alert, cooperative and no distress Chest: no respiratory distress Abdomen: soft, non-tender  Uterine Fundus: firm, appropriately tender Extremities: No calf swelling or tenderness  No edema  Recent Labs    06/03/19 0344 06/04/19 0424  HGB 13.2 12.9  HCT 40.6 39.8    Assessment/Plan: Lindsey Sherman is a 24 y.o. G1P1001 s/p vaginal delivery at [redacted]w[redacted]d.  Routine Postpartum Care: Doing well, pain well-controlled.  -- Continue routine care, lactation support  -- Contraception: Nexplanon placed -- Feeding: breast  Dispo: Plan for discharge tomorrow.  Marlowe Alt, DO OB/GYN Fellow, [facility]
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
## 2019-06-06 DIAGNOSIS — Z975 Presence of (intrauterine) contraceptive device: Secondary | ICD-10-CM

## 2019-06-06 MED ORDER — ACETAMINOPHEN 325 MG PO TABS: 650.0000 mg | ORAL_TABLET | ORAL | 0 refills | Status: DC | PRN

## 2019-06-06 MED ORDER — IBUPROFEN 600 MG PO TABS: 600.0000 mg | ORAL_TABLET | Freq: Four times a day (QID) | ORAL | 0 refills | Status: DC

## 2019-06-06 MED ORDER — POLYETHYLENE GLYCOL 3350 17 GM/SCOOP PO POWD: 17.0000 g | Freq: Every day | ORAL | 1 refills | Status: DC | PRN

## 2019-06-06 NOTE — Lactation Note (Signed)
This note was copied from a baby's chart. Lactation Consultation Note  Mother is a P, infant is 66 hours old. Infant fussy and cuing. Mother attempting to put pacifier in infants mouth.  Assist mother with placing infant on the rt breast in football hold. Infant latched well with good depth. Mother has large supply of milk.when hand expressed.  Assist mother with placing infant on the left breast infant sustained latch again for another entire feeding.  Discussion about pacifier use.   Plan of Care : Breastfeed infant with feeding cues Staff nurse to give mother a harmony hand pump with instructions.   Mother to continue to cue base feed infant and feed at least 8-12 times or more in 24 hours and advised to allow for cluster feeding infant as needed.   Mother to continue to due STS. Mother is aware of available LC services at [facility], BFSG'S, OP Dept, and phone # for questions or concerns about breastfeeding.  Mother receptive to all teaching and plan of care.    Patient Name: Lindsey Sherman Today's Date: 06/06/2019 Reason for consult: Follow-up assessment   Maternal Data    Feeding Feeding Type: Breast Fed  LATCH Score Latch: Grasps breast easily, tongue down, lips flanged, rhythmical sucking.  Audible Swallowing: Spontaneous and intermittent  Type of Nipple: Everted at rest and after stimulation  Comfort (Breast/Nipple): Filling, red/small blisters or bruises, mild/mod discomfort  Hold (Positioning): Assistance needed to correctly position infant at breast and maintain latch.  LATCH Score: 8  Interventions    Lactation Tools Discussed/Used     Consult Status      Michel Bickers 06/06/2019, 12:07 PM

## 2019-06-06 NOTE — Discharge Instructions (Signed)

## 2019-06-07 ENCOUNTER — Encounter: Payer: Medicaid Other | Admitting: Obstetrics & Gynecology

## 2019-06-12 ENCOUNTER — Other Ambulatory Visit (HOSPITAL_COMMUNITY): Payer: Medicaid Other

## 2019-06-14 ENCOUNTER — Inpatient Hospital Stay (HOSPITAL_COMMUNITY)
Admission: AD | Admit: 2019-06-14 | Payer: Medicaid Other | Source: Home / Self Care | Admitting: Obstetrics and Gynecology

## 2019-06-14 ENCOUNTER — Inpatient Hospital Stay (HOSPITAL_COMMUNITY): Payer: Medicaid Other

## 2019-07-06 ENCOUNTER — Ambulatory Visit: Payer: Medicaid Other | Admitting: Obstetrics & Gynecology

## 2019-07-06 NOTE — Progress Notes (Deleted)
   Patient did not show up today for her scheduled appointment.   Damyon Mullane, MD, FACOG Obstetrician & Gynecologist, [facility], [facility]  

## 2019-07-30 ENCOUNTER — Ambulatory Visit (INDEPENDENT_AMBULATORY_CARE_PROVIDER_SITE_OTHER)
Admission: RE | Admit: 2019-07-30 | Discharge: 2019-07-30 | Disposition: A | Discharge status: Home / Self Care | Payer: Medicaid Other | Source: Ambulatory Visit

## 2019-07-30 DIAGNOSIS — M25531 Pain in right wrist: Secondary | ICD-10-CM

## 2019-07-30 DIAGNOSIS — Z789 Other specified health status: Secondary | ICD-10-CM

## 2019-07-30 DIAGNOSIS — M25532 Pain in left wrist: Secondary | ICD-10-CM | POA: Diagnosis not present

## 2019-07-30 MED ORDER — NAPROXEN 375 MG PO TABS: 375.0000 mg | ORAL_TABLET | Freq: Two times a day (BID) | ORAL | 0 refills | Status: DC

## 2019-07-30 NOTE — ED Provider Notes (Signed)
Virtual Visit via Video Note:  Lindsey Sherman  initiated request for Telemedicine visit with [facility] team. I connected with Lindsey Sherman  on 07/30/2019 at 2:49 PM  for a synchronized telemedicine visit using a video enabled HIPPA compliant telemedicine application. I verified that I am speaking with Lindsey Sherman  using two identifiers. Lindsey Eagles, PA-C  was physically located in a [facility]gent care site and Lindsey Sherman was located at a different location.   The limitations of evaluation and management by telemedicine as well as the availability of in-person appointments were discussed. Patient was informed that she  may incur a bill ( including co-pay) for this virtual visit encounter. Lindsey Sherman  expressed understanding and gave verbal consent to proceed with virtual visit.     History of Present Illness:Lindsey Sherman  is a 24 y.o. female presents with 2 week hx of bilateral wrist pain, L>R. She is 2 months post-partum. 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 Denies fall, trauma, bruising. Has not used oral medication because she is breastfeeding.     ROS  No current facility-administered medications for this encounter.   Current Outpatient Medications  Medication Sig Dispense Refill  . acetaminophen (TYLENOL) 325 MG tablet Take 2 tablets (650 mg total) by mouth every 4 (four) hours as needed (for pain scale < 4). 30 tablet 0  . cetirizine (ZYRTEC) 10 MG tablet Take by mouth.    . famotidine (PEPCID) 20 MG tablet Take 1 tablet (20 mg total) by mouth 2 (two) times daily. 60 tablet 3  . fluticasone (FLONASE) 50 MCG/ACT nasal spray Place into the nose.    . ibuprofen (ADVIL) 600 MG tablet Take 1 tablet (600 mg total) by mouth every 6 (six) hours. 30 tablet 0  . metoCLOPramide (REGLAN) 10 MG tablet Take 10 mg by mouth every 6 (six) hours as needed for nausea or  vomiting.     . polyethylene glycol powder (GLYCOLAX/MIRALAX) 17 GM/SCOOP powder Take 17 g by mouth daily as needed for moderate constipation. 510 g 1  . Prenatal Vit-Fe Fumarate-FA (PRENATAL VITAMIN) 27-0.8 MG TABS Take 1 tablet by mouth daily. 30 tablet 12  . promethazine (PHENERGAN) 25 MG tablet Take by mouth.       Allergies  Allergen Reactions  . Penicillin G Hives    Other reaction(s): Vomiting  . Penicillins   . Amoxicillin Nausea And Vomiting     Past Medical History:  Diagnosis Date  . Anxiety   . Colitis   . H/O hernia repair 2008   done at [facility]    Past Surgical History:  Procedure Laterality Date  . h/o hernia repair  2008  . HERNIA REPAIR        Observations/Objective: Physical Exam Constitutional:      General: She is not in acute distress.    Appearance: Normal appearance. She is well-developed. She is not ill-appearing, toxic-appearing or diaphoretic.  Eyes:     Extraocular Movements: Extraocular movements intact.  Pulmonary:     Effort: Pulmonary effort is normal.  Neurological:     General: No focal deficit present.     Mental Status: She is alert and oriented to person, place, and time.  Psychiatric:        Mood and Affect: Mood normal.        Behavior: Behavior normal.  Thought Content: Thought content normal.        Judgment: Judgment normal.    Visit done through phone only.  Assessment and Plan:  PDMP not reviewed this encounter.  1. Pain in both wrists   2. Breastfeeding (infant)    Start naproxen 375mg  BID with food prn. The RID is 3.3% for 750mg /day and therefore should be safe in pregnancy. Recommended modifications of her ADLs, getting help with family and friends to rest her wrists. Use Ace wrap prn. Should have in person OV if sx persist. Counseled patient on potential for adverse effects with medications prescribed/recommended today, ER and return-to-clinic precautions discussed, patient verbalized  understanding.    Follow Up Instructions:    I discussed the assessment and treatment plan with the patient. The patient was provided an opportunity to ask questions and all were answered. The patient agreed with the plan and demonstrated an understanding of the instructions.   The patient was advised to call back or seek an in-person evaluation if the symptoms worsen or if the condition fails to improve as anticipated.  I provided 8 minutes of non-face-to-face time during this encounter.    , PA-C  07/30/2019 2:49 PM         Wallis Bamberg, PA-C 07/30/19 1457

## 2019-08-20 ENCOUNTER — Other Ambulatory Visit: Payer: Self-pay

## 2019-08-20 ENCOUNTER — Ambulatory Visit (INDEPENDENT_AMBULATORY_CARE_PROVIDER_SITE_OTHER): Payer: Medicaid Other | Admitting: Family Medicine

## 2019-08-20 DIAGNOSIS — M654 Radial styloid tenosynovitis [de Quervain]: Secondary | ICD-10-CM

## 2019-08-20 NOTE — Progress Notes (Signed)
    Post Partum Visit Note  Lindsey Sherman is a 24 y.o. G49P1001 female who presents for a postpartum visit. She is 10 weeks postpartum following a normal spontaneous vaginal delivery.  I have fully reviewed the prenatal and intrapartum course. The delivery was at 39 gestational weeks.  Anesthesia: Nitrous . Postpartum course has been normal. Baby is doing well. Baby is feeding by breast. Bleeding no bleeding. Bowel function is normal. Bladder function is normal. Patient is sexually active. Contraception method is Nexplanon. Postpartum depression screening: negative.  Does have some pain in radial aspect of wrists R>L. Started a few weeks before delivery and has been on going.  The following portions of the patient's history were reviewed and updated as appropriate: allergies, current medications, past family history, past medical history, past social history, past surgical history and problem list.  Review of Systems Pertinent items are noted in HPI.    Objective:  Blood pressure 116/73, pulse 67, weight 157 lb (71.2 kg), last menstrual period 08/20/2018, currently breastfeeding.  General:  alert and cooperative  Lungs: clear to auscultation bilaterally  Heart:  regular rate and rhythm, S1, S2 normal, no murmur, click, rub or gallop  Abdomen: soft, non-tender; bowel sounds normal; no masses,  no organomegaly  MSK Finkelstein's +        Assessment:    normal postpartum exam. Pap smear not done at today's visit.   Plan:   Essential components of care per ACOG recommendations:  1.  Mood and well being: Patient with negative depression screening today. Reviewed local resources for support.  - Patient does not use tobacco.   2. Infant care and feeding:  -Patient currently breastmilk feeding? Yes -Social determinants of health (SDOH) reviewed in EPIC. No concerns  3. Sexuality, contraception and birth spacing - Patient does not want a pregnancy in the next year.   - Reviewed forms of  contraception in tiered fashion. Patient desired nexplanon today.   - Discussed birth spacing of 18 months  4. Sleep and fatigue -Encouraged family/partner/community support of 4 hrs of uninterrupted sleep to help with mood and fatigue  5. Physical Recovery  - Discussed patients delivery - Patient has urinary incontinence? No - Patient is safe to resume physical and sexual activity  6.  Health Maintenance PAP smear outside of system within last 3 years.  7. De quervain's tensenovitis - refer to sports med.  Levie Heritage, DO [facility] [employer], [facility]

## 2019-08-21 ENCOUNTER — Ambulatory Visit: Payer: Medicaid Other | Admitting: Family Medicine

## 2019-08-21 NOTE — Progress Notes (Deleted)
  Lindsey Sherman - 24 y.o. female MRN [id number]  Date of birth: 02/05/1996  SUBJECTIVE:  Including CC & ROS.  No chief complaint on file.   Lindsey Sherman is a 24 y.o. female that is  ***.  ***   Review of Systems See HPI   HISTORY: Past Medical, Surgical, Social, and Family History Reviewed & Updated per EMR.   Pertinent Historical Findings include:  Past Medical History:  Diagnosis Date  . Anxiety   . Colitis   . H/O hernia repair 2008   done at [place]    Past Surgical History:  Procedure Laterality Date  . h/o hernia repair  2008  . HERNIA REPAIR      Family History  Problem Relation Age of Onset  . Hypertension Mother   . Diabetes Maternal Grandmother   . Hypertension Maternal Grandmother   . Osteoarthritis Maternal Grandmother   . Heart failure Maternal Grandmother   . Stroke Maternal Grandmother     Social History   Socioeconomic History  . Marital status: Single    Spouse name: Not on file  . Number of children: Not on file  . Years of education: 18  . Highest education level: Not on file  Occupational History  . Occupation: Crew Member    Employer: [employer]  Tobacco Use  . Smoking status: Former Smoker    Types: Cigarettes  . Smokeless tobacco: Never Used  Vaping Use  . Vaping Use: Never used  Substance and Sexual Activity  . Alcohol use: Yes    Comment: occ  . Drug use: Yes    Types: Marijuana  . Sexual activity: Not on file  Other Topics Concern  . Not on file  Social History Narrative  . Not on file   Social Determinants of Health   Financial Resource Strain:   . Difficulty of Paying Living Expenses:   Food Insecurity:   . Worried About Running Out of Food in the Last Year:   . Ran Out of Food in the Last Year:   Transportation Needs:   . Lack of Transportation (Medical):   . Lack of Transportation (Non-Medical):   Physical Activity:   . Days of Exercise per Week:   . Minutes of Exercise per Session:   Stress:   . Feeling  of Stress :   Social Connections:   . Frequency of Communication with Friends and Family:   . Frequency of Social Gatherings with Friends and Family:   . Attends Religious Services:   . Active Member of Clubs or Organizations:   . Attends Club or Organization Meetings:   . Marital Status:   Intimate Partner Violence:   . Fear of Current or Ex-Partner:   . Emotionally Abused:   . Physically Abused:   . Sexually Abused:      PHYSICAL EXAM:  VS: There were no vitals taken for this visit. Physical Exam Gen: NAD, alert, cooperative with exam, well-appearing MSK:  ***      ASSESSMENT & PLAN:   No problem-specific Assessment & Plan notes found for this encounter.     

## 2019-08-27 ENCOUNTER — Ambulatory Visit (INDEPENDENT_AMBULATORY_CARE_PROVIDER_SITE_OTHER): Payer: Medicaid Other | Admitting: Family Medicine

## 2019-08-27 ENCOUNTER — Ambulatory Visit: Payer: Self-pay

## 2019-08-27 ENCOUNTER — Encounter: Payer: Self-pay | Admitting: Family Medicine

## 2019-08-27 ENCOUNTER — Other Ambulatory Visit: Payer: Self-pay

## 2019-08-27 VITALS — BP 122/80 | HR 70 | Ht 62.0 in | Wt 157.0 lb

## 2019-08-27 DIAGNOSIS — M654 Radial styloid tenosynovitis [de Quervain]: Secondary | ICD-10-CM | POA: Diagnosis present

## 2019-08-27 MED ORDER — METHYLPREDNISOLONE ACETATE 40 MG/ML IJ SUSP
40.0000 mg | Freq: Once | INTRAMUSCULAR | Status: AC
  Administered 2019-08-27: 40 mg via INTRA_ARTICULAR

## 2019-08-27 NOTE — Progress Notes (Signed)
Lindsey Sherman - 24 y.o. female MRN [id number]  Date of birth: [birth year]  SUBJECTIVE:  Including CC & ROS.  Chief Complaint  Patient presents with  . Wrist Pain    bilateral - Right worse x 2 months    Lindsey Sherman is a 24 y.o. female that is presenting with right wrist pain.  The pain is occurring over the radial aspect of the wrist.  Is been ongoing for the past month.  It is severe in nature.  Seems more constant.  No history inciting or trauma.  She is gave birth to her child couple months ago.  No improvement with modalities to date.   Review of Systems See HPI   HISTORY: Past Medical, Surgical, Social, and Family History Reviewed & Updated per EMR.   Pertinent Historical Findings include:  Past Medical History:  Diagnosis Date  . Anxiety   . Colitis   . H/O hernia repair 2008   done at [facility]    Past Surgical History:  Procedure Laterality Date  . h/o hernia repair  2008  . HERNIA REPAIR      Family History  Problem Relation Age of Onset  . Hypertension Mother   . Diabetes Maternal Grandmother   . Hypertension Maternal Grandmother   . Osteoarthritis Maternal Grandmother   . Heart failure Maternal Grandmother   . Stroke Maternal Grandmother     Social History   Socioeconomic History  . Marital status: Single    Spouse name: Not on file  . Number of children: Not on file  . Years of education: 20  . Highest education level: Not on file  Occupational History  . Occupation: Airline pilot: [employer]  Tobacco Use  . Smoking status: Former Smoker    Types: Cigarettes  . Smokeless tobacco: Never Used  Vaping Use  . Vaping Use: Never used  Substance and Sexual Activity  . Alcohol use: Yes    Comment: occ  . Drug use: Yes    Types: Marijuana  . Sexual activity: Not on file  Other Topics Concern  . Not on file  Social History Narrative  . Not on file   Social Determinants of Health   Financial Resource Strain:   . Difficulty of Paying  Living Expenses:   Food Insecurity:   . Worried About Programme researcher, broadcasting/film/video in the Last Year:   . Barista in the Last Year:   Transportation Needs:   . Freight forwarder (Medical):   Marland Kitchen Lack of Transportation (Non-Medical):   Physical Activity:   . Days of Exercise per Week:   . Minutes of Exercise per Session:   Stress:   . Feeling of Stress :   Social Connections:   . Frequency of Communication with Friends and Family:   . Frequency of Social Gatherings with Friends and Family:   . Attends Religious Services:   . Active Member of Clubs or Organizations:   . Attends Banker Meetings:   Marland Kitchen Marital Status:   Intimate Partner Violence:   . Fear of Current or Ex-Partner:   . Emotionally Abused:   Marland Kitchen Physically Abused:   . Sexually Abused:      PHYSICAL EXAM:  VS: BP 122/80   Pulse 70   Ht 5\' 2"  (1.575 m)   Wt 157 lb (71.2 kg)   BMI 28.72 kg/m  Physical Exam Gen: NAD, alert, cooperative with exam, well-appearing MSK:  Right wrist: Tenderness  palpation of the first dorsal compartment.   Normal strength resistance with thumb extension. Positive Finkelstein's test. Neurovascular intact  Limited ultrasound: Right wrist:  No effusion in the wrist joint. Hyperemia and thickening of the first dorsal compartment.  Summary: Findings would suggest de Quervain's tenosynovitis.  Ultrasound and interpretation by Clearance Coots, MD    Aspiration/Injection Procedure Note Lindsey Sherman [birth year]  Procedure: Injection Indications: Right wrist pain  Procedure Details Consent: Risks of procedure as well as the alternatives and risks of each were explained to the (patient/caregiver).  Consent for procedure obtained. Time Out: Verified patient identification, verified procedure, site/side was marked, verified correct patient position, special equipment/implants available, medications/allergies/relevent history reviewed, required imaging and test results  available.  Performed.  The area was cleaned with iodine and alcohol swabs.    The right first dorsal compartment was injected using 1 cc's of 40 mg Depo-Medrol and 1 cc's of 0.25% bupivacaine with a 25 1 1/2" needle.  Ultrasound was used. Images were obtained in long views showing the injection.     A sterile dressing was applied.  Patient did tolerate procedure well.    ASSESSMENT & PLAN:   De Quervain's tenosynovitis, right Findings would suggest de Quervain's.  Seems a little bit of carpal tunnel starting as well.  Likely related to cautious with holding the baby. -Counseled on home exercise therapy and supportive care. -Injection. -Provided thumb spica. -Could consider physical therapy.

## 2019-08-27 NOTE — Assessment & Plan Note (Signed)
Findings would suggest de Quervain's.  Seems a little bit of carpal tunnel starting as well.  Likely related to cautious with holding the baby. -Counseled on home exercise therapy and supportive care. -Injection. -Provided thumb spica. -Could consider physical therapy.

## 2019-08-27 NOTE — Patient Instructions (Signed)
Nice to meet you Please try the exercises  Please try ice  Please try the brace at night   Please send me a message in MyChart with any questions or updates.  Please see me back in 4 weeks.   --Dr. Jordan Likes

## 2019-09-24 ENCOUNTER — Ambulatory Visit: Payer: Medicaid Other | Admitting: Family Medicine

## 2019-09-24 NOTE — Progress Notes (Deleted)
  Lindsey Sherman - 24 y.o. female MRN [id number]  Date of birth: [birth year]  SUBJECTIVE:  Including CC & ROS.  No chief complaint on file.   Lindsey Sherman is a 24 y.o. female that is  ***.  ***   Review of Systems See HPI   HISTORY: Past Medical, Surgical, Social, and Family History Reviewed & Updated per EMR.   Pertinent Historical Findings include:  Past Medical History:  Diagnosis Date  . Anxiety   . Colitis   . H/O hernia repair 2008   done at [facility]    Past Surgical History:  Procedure Laterality Date  . h/o hernia repair  2008  . HERNIA REPAIR      Family History  Problem Relation Age of Onset  . Hypertension Mother   . Diabetes Maternal Grandmother   . Hypertension Maternal Grandmother   . Osteoarthritis Maternal Grandmother   . Heart failure Maternal Grandmother   . Stroke Maternal Grandmother     Social History   Socioeconomic History  . Marital status: Single    Spouse name: Not on file  . Number of children: Not on file  . Years of education: 53  . Highest education level: Not on file  Occupational History  . Occupation: Airline pilot: [employer]  Tobacco Use  . Smoking status: Former Smoker    Types: Cigarettes  . Smokeless tobacco: Never Used  Vaping Use  . Vaping Use: Never used  Substance and Sexual Activity  . Alcohol use: Yes    Comment: occ  . Drug use: Yes    Types: Marijuana  . Sexual activity: Not on file  Other Topics Concern  . Not on file  Social History Narrative  . Not on file   Social Determinants of Health   Financial Resource Strain:   . Difficulty of Paying Living Expenses:   Food Insecurity:   . Worried About Programme researcher, broadcasting/film/video in the Last Year:   . Barista in the Last Year:   Transportation Needs:   . Freight forwarder (Medical):   Marland Kitchen Lack of Transportation (Non-Medical):   Physical Activity:   . Days of Exercise per Week:   . Minutes of Exercise per Session:   Stress:   . Feeling  of Stress :   Social Connections:   . Frequency of Communication with Friends and Family:   . Frequency of Social Gatherings with Friends and Family:   . Attends Religious Services:   . Active Member of Clubs or Organizations:   . Attends Banker Meetings:   Marland Kitchen Marital Status:   Intimate Partner Violence:   . Fear of Current or Ex-Partner:   . Emotionally Abused:   Marland Kitchen Physically Abused:   . Sexually Abused:      PHYSICAL EXAM:  VS: There were no vitals taken for this visit. Physical Exam Gen: NAD, alert, cooperative with exam, well-appearing MSK:  ***      ASSESSMENT & PLAN:   No problem-specific Assessment & Plan notes found for this encounter.

## 2020-01-07 ENCOUNTER — Encounter (HOSPITAL_BASED_OUTPATIENT_CLINIC_OR_DEPARTMENT_OTHER): Payer: Self-pay | Admitting: *Deleted

## 2020-01-07 ENCOUNTER — Emergency Department (HOSPITAL_BASED_OUTPATIENT_CLINIC_OR_DEPARTMENT_OTHER)
Admission: EM | Admit: 2020-01-07 | Discharge: 2020-01-07 | Disposition: A | Discharge status: Home / Self Care | Payer: Medicaid Other | Attending: Emergency Medicine | Admitting: Emergency Medicine

## 2020-01-07 ENCOUNTER — Other Ambulatory Visit: Payer: Self-pay

## 2020-01-07 ENCOUNTER — Emergency Department (HOSPITAL_BASED_OUTPATIENT_CLINIC_OR_DEPARTMENT_OTHER): Payer: Medicaid Other

## 2020-01-07 DIAGNOSIS — W01198A Fall on same level from slipping, tripping and stumbling with subsequent striking against other object, initial encounter: Secondary | ICD-10-CM | POA: Insufficient documentation

## 2020-01-07 DIAGNOSIS — Y92003 Bedroom of unspecified non-institutional (private) residence as the place of occurrence of the external cause: Secondary | ICD-10-CM | POA: Diagnosis not present

## 2020-01-07 DIAGNOSIS — W19XXXA Unspecified fall, initial encounter: Secondary | ICD-10-CM

## 2020-01-07 DIAGNOSIS — Y999 Unspecified external cause status: Secondary | ICD-10-CM | POA: Diagnosis not present

## 2020-01-07 DIAGNOSIS — Z79899 Other long term (current) drug therapy: Secondary | ICD-10-CM | POA: Insufficient documentation

## 2020-01-07 DIAGNOSIS — Z87891 Personal history of nicotine dependence: Secondary | ICD-10-CM | POA: Insufficient documentation

## 2020-01-07 DIAGNOSIS — S0083XA Contusion of other part of head, initial encounter: Secondary | ICD-10-CM | POA: Insufficient documentation

## 2020-01-07 DIAGNOSIS — Y9389 Activity, other specified: Secondary | ICD-10-CM | POA: Diagnosis not present

## 2020-01-07 DIAGNOSIS — S6992XA Unspecified injury of left wrist, hand and finger(s), initial encounter: Secondary | ICD-10-CM | POA: Diagnosis not present
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
## 2020-01-07 NOTE — Discharge Instructions (Signed)
Plain films obtained of the left hand were unremarkable.  As for your contusion on your chin, suspect this is simply a bruise/hematoma.  Lower suspicion for fracture or other acute bony abnormalities.  Please follow-up with your primary care provider regarding today's encounter and for ongoing evaluation and management.  Return to the ED or seek immediate medical attention should you experience any new or worsening symptoms.

## 2020-01-07 NOTE — ED Triage Notes (Signed)
C/o fall from standing landing onto box c/o chin pain and left ring finger injury

## 2020-01-07 NOTE — ED Notes (Signed)
Review D/C papers with pt, pt states understanding, pt denies questions at this time. 

## 2020-09-25 ENCOUNTER — Encounter: Payer: Self-pay | Admitting: Physician Assistant

## 2020-09-25 ENCOUNTER — Encounter: Payer: Medicaid Other | Admitting: Physician Assistant

## 2020-09-25 ENCOUNTER — Telehealth: Payer: Medicaid Other | Admitting: Physician Assistant

## 2020-09-25 DIAGNOSIS — Z20822 Contact with and (suspected) exposure to covid-19: Secondary | ICD-10-CM

## 2020-09-25 MED ORDER — BENZONATATE 100 MG PO CAPS: 100.0000 mg | ORAL_CAPSULE | Freq: Three times a day (TID) | ORAL | 0 refills | Status: DC | PRN

## 2020-09-25 NOTE — Progress Notes (Signed)
I have spent 5 minutes in review of e-visit questionnaire, review and updating patient chart, medical decision making and response to patient.   Nikka Hakimian Cody Baila Rouse, PA-C    

## 2020-09-25 NOTE — Progress Notes (Signed)
E-Visit for Corona Virus Screening ° °Your current symptoms could be consistent with the coronavirus.  Many health care providers can now test patients at their office but not all are.  [facility] has multiple testing sites. For information on our COVID testing locations and hours go to [email or web address] ° °We are enrolling you in our MyChart Home Monitoring for COVID19 . Daily you will receive a questionnaire within the MyChart website. Our COVID 19 response team will be monitoring your responses daily. ° °Testing Information: °The COVID-19 Community Testing sites are testing BY APPOINTMENT ONLY.  You can schedule online at [email or web address]  If you do not have access to a smart phone or computer you may call [phone] for an appointment. ° ° °Additional testing sites in the Community: ° °For [facility] Testing sites in [place]  https://www.[facility]vs.com/minuteclinic/covid-19-testing ° °For Pop-up testing sites in   https://covid19.ncdhhs.gov/about-covid-19/testing/find-my-testing-place/pop-testing-sites ° °For [facility] https://[place].gov[place]/human-services/health-department/coronavirus-covid-19-info/covid-19-testing ° °For [place] testing in [place] and [place] https://www[place].gov[place]/human-services/health-department/coronavirus-covid-19-info/covid-19-testing ° °For [facility] testing in [place]   https://[facility].care/covidtesting ° °For  more information about community testing call [phone] ° ° °Please quarantine yourself while awaiting your test results. Please stay home for a minimum of 10 days from the first day of illness with improving symptoms and you have had 24 hours of no fever (without the use of Tylenol (Acetaminophen) Motrin (Ibuprofen) or any fever reducing medication).  Also - Do not get tested prior to returning to work because once you have had a positive test the test can stay positive for  more than a month in some cases.  ° °You should wear a mask or cloth face covering over your nose and mouth if you must be around other people or animals, including pets (even at home). Try to stay at least 6 feet away from other people. This will protect the people around you.  Please continue good preventive care measures, including:  frequent hand-washing, avoid touching your face, cover coughs/sneezes, stay out of crowds and keep a 6 foot distance from others.  COVID-19 is a respiratory illness with symptoms that are similar to the flu. Symptoms are typically mild to moderate, but there have been cases of severe illness and death due to the virus.  ° °The following symptoms may appear 2-14 days after exposure: °Fever °Cough °Shortness of breath or difficulty breathing °Chills °Repeated shaking with chills °Muscle pain °Headache °Sore throat °New loss of taste or smell °Fatigue °Congestion or runny nose °Nausea or vomiting °Diarrhea ° °Go to the nearest hospital ED for assessment if fever/cough/breathlessness are severe or illness seems like a threat to life.  It is vitally important that if you feel that you have an infection such as this virus or any other virus that you stay home and away from places where you may spread it to others.  You should avoid contact with people age 65 and older.  ° °You can use medication such as prescription cough medication called Tessalon Perles 100 mg. You may take 1-2 capsules every 8 hours as needed for cough ° °You may also take acetaminophen (Tylenol) as needed for fever. ° °Reduce your risk of any infection by using the same precautions used for avoiding the common cold or flu:  °Wash your hands often with soap and warm water for at least 20 seconds.  If soap and water are not readily available, use an alcohol-based hand sanitizer with at least 60% alcohol.  °If coughing or sneezing, cover your mouth   and nose by coughing or sneezing into the elbow areas of your shirt or  coat, into a tissue or into your sleeve (not your hands). °Avoid shaking hands with others and consider head nods or verbal greetings only. °Avoid touching your eyes, nose, or mouth with unwashed hands.  °Avoid close contact with people who are sick. °Avoid places or events with large numbers of people in one location, like concerts or sporting events. °Carefully consider travel plans you have or are making. °If you are planning any travel outside or inside the [place], visit the CDC's Travelers' Health webpage for the latest health notices. °If you have some symptoms but not all symptoms, continue to monitor at home and seek medical attention if your symptoms worsen. °If you are having a medical emergency, call 911. ° °HOME CARE °Only take medications as instructed by your medical team. °Drink plenty of fluids and get plenty of rest. °A steam or ultrasonic humidifier can help if you have congestion.  ° °GET HELP RIGHT AWAY IF YOU HAVE EMERGENCY WARNING SIGNS** FOR COVID-19. If you or someone is showing any of these signs seek emergency medical care immediately. Call 911 or proceed to your closest emergency facility if: °You develop worsening high fever. °Trouble breathing °Bluish lips or face °Persistent pain or pressure in the chest °New confusion °Inability to wake or stay awake °You cough up blood. °Your symptoms become more severe ° °**This list is not all possible symptoms. Contact your medical provider for any symptoms that are sever or concerning to you. ° °MAKE SURE YOU  °Understand these instructions. °Will watch your condition. °Will get help right away if you are not doing well or get worse. ° °Your e-visit answers were reviewed by a board certified advanced clinical practitioner to complete your personal care plan.  Depending on the condition, your plan could have included both over the counter or prescription medications.  If there is a problem please reply once you have received a response from your  provider. ° °Your safety is important to us.  If you have drug allergies check your prescription carefully.   ° °You can use MyChart to ask questions about today's visit, request a non-urgent call back, or ask for a work or school excuse for 24 hours related to this e-Visit. If it has been greater than 24 hours you will need to follow up with your provider, or enter a new e-Visit to address those concerns. °You will get an e-mail in the next two days asking about your experience.  I hope that your e-visit has been valuable and will speed your recovery. Thank you for using e-visits. ° ° °

## 2020-10-02 NOTE — Progress Notes (Signed)
No show

## 2021-01-23 ENCOUNTER — Telehealth: Payer: Medicaid Other | Admitting: Nurse Practitioner

## 2021-01-23 DIAGNOSIS — J069 Acute upper respiratory infection, unspecified: Secondary | ICD-10-CM

## 2021-01-23 MED ORDER — FLUTICASONE PROPIONATE 50 MCG/ACT NA SUSP: 2.0000 | Freq: Every day | NASAL | 6 refills | Status: DC

## 2021-01-23 MED ORDER — BENZONATATE 100 MG PO CAPS: 100.0000 mg | ORAL_CAPSULE | Freq: Three times a day (TID) | ORAL | 0 refills | Status: AC | PRN

## 2021-01-23 NOTE — Progress Notes (Signed)
E-Visit for Upper Respiratory Infection   We are sorry you are not feeling well.  Here is how we plan to help!  Based on what you have shared with me, it looks like you may have a viral upper respiratory infection.  Upper respiratory infections are caused by a large number of viruses; however, rhinovirus is the most common cause.   Symptoms vary from person to person, with common symptoms including sore throat, cough, fatigue or lack of energy and feeling of general discomfort.  A low-grade fever of up to 100.4 may present, but is often uncommon.  Symptoms vary however, and are closely related to a person's age or underlying illnesses.  The most common symptoms associated with an upper respiratory infection are nasal discharge or congestion, cough, sneezing, headache and pressure in the ears and face.  These symptoms usually persist for about 3 to 10 days, but can last up to 2 weeks.  It is important to know that upper respiratory infections do not cause serious illness or complications in most cases.    Upper respiratory infections can be transmitted from person to person, with the most common method of transmission being a person's hands.  The virus is able to live on the skin and can infect other persons for up to 2 hours after direct contact.  Also, these can be transmitted when someone coughs or sneezes; thus, it is important to cover the mouth to reduce this risk.  To keep the spread of the illness at bay, good hand hygiene is very important.  This is an infection that is most likely caused by a virus. There are no specific treatments other than to help you with the symptoms until the infection runs its course.  We are sorry you are not feeling well.  Here is how we plan to help!   For nasal congestion, you may use an oral decongestants such as Mucinex D or if you have glaucoma or high blood pressure use plain Mucinex.  Saline nasal spray or nasal drops can help and can safely be used as often as  needed for congestion.  For your congestion, I have prescribed Fluticasone nasal spray one spray in each nostril twice a day  If you do not have a history of heart disease, hypertension, diabetes or thyroid disease, prostate/bladder issues or glaucoma, you may also use Sudafed to treat nasal congestion.  It is highly recommended that you consult with a pharmacist or your primary care physician to ensure this medication is safe for you to take.     If you have a cough, you may use cough suppressants such as Delsym and Robitussin.  If you have glaucoma or high blood pressure, you can also use Coricidin HBP.   For cough I have prescribed for you A prescription cough medication called Tessalon Perles 100 mg. You may take 1-2 capsules every 8 hours as needed for cough  If you have a sore or scratchy throat, use a saltwater gargle-  to  teaspoon of salt dissolved in a 4-ounce to 8-ounce glass of warm water.  Gargle the solution for approximately 15-30 seconds and then spit.  It is important not to swallow the solution.  You can also use throat lozenges/cough drops and Chloraseptic spray to help with throat pain or discomfort.  Warm or cold liquids can also be helpful in relieving throat pain.  For headache, pain or general discomfort, you can use Ibuprofen or Tylenol as directed.   Some authorities believe   that zinc sprays or the use of Echinacea may shorten the course of your symptoms.   HOME CARE Only take medications as instructed by your medical team. Be sure to drink plenty of fluids. Water is fine as well as fruit juices, sodas and electrolyte beverages. You may want to stay away from caffeine or alcohol. If you are nauseated, try taking small sips of liquids. How do you know if you are getting enough fluid? Your urine should be a pale yellow or almost colorless. Get rest. Taking a steamy shower or using a humidifier may help nasal congestion and ease sore throat pain. You can place a towel over  your head and breathe in the steam from hot water coming from a faucet. Using a saline nasal spray works much the same way. Cough drops, hard candies and sore throat lozenges may ease your cough. Avoid close contacts especially the very young and the elderly Cover your mouth if you cough or sneeze Always remember to wash your hands.   GET HELP RIGHT AWAY IF: You develop worsening fever. If your symptoms do not improve within 10 days You develop yellow or green discharge from your nose over 3 days. You have coughing fits You develop a severe head ache or visual changes. You develop shortness of breath, difficulty breathing or start having chest pain Your symptoms persist after you have completed your treatment plan  MAKE SURE YOU  Understand these instructions. Will watch your condition. Will get help right away if you are not doing well or get worse.  Thank you for choosing an e-visit.  Your e-visit answers were reviewed by a board certified advanced clinical practitioner to complete your personal care plan. Depending upon the condition, your plan could have included both over the counter or prescription medications.  Please review your pharmacy choice. Make sure the pharmacy is open so you can pick up prescription now. If there is a problem, you may contact your provider through [employer] and have the prescription routed to another pharmacy.  Your safety is important to [place]. If you have drug allergies check your prescription carefully.   For the next 24 hours you can use MyChart to ask questions about today's visit, request a non-urgent call back, or ask for a work or school excuse. You will get an email in the next two days asking about your experience. I hope that your e-visit has been valuable and will speed your recovery. Meds ordered this encounter  Medications   fluticasone (FLONASE) 50 MCG/ACT nasal spray    Sig: Place 2 sprays into both nostrils daily.    Dispense:   16 g    Refill:  6   benzonatate (TESSALON) 100 MG capsule    Sig: Take 1 capsule (100 mg total) by mouth 3 (three) times daily as needed for up to 10 days for cough.    Dispense:  30 capsule    Refill:  0     I spent approximately 7 minutes reviewing the patient's history, current symptoms and coordinating their plan of care today.

## 2021-01-26 ENCOUNTER — Telehealth: Payer: Medicaid Other | Admitting: Family

## 2021-01-26 DIAGNOSIS — J209 Acute bronchitis, unspecified: Secondary | ICD-10-CM | POA: Diagnosis not present

## 2021-01-26 MED ORDER — PREDNISONE 10 MG (21) PO TBPK: ORAL_TABLET | ORAL | 0 refills | Status: DC
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
## 2021-05-07 IMAGING — DX DG FINGER RING 2+V*L*
3 series · 3 of 3 positions shown · non-contrast
Comparison: None.

CLINICAL DATA: 24-year-old female with fall and trauma to the left
fourth digit.

EXAM:
LEFT RING FINGER 2+V

[finger ap]
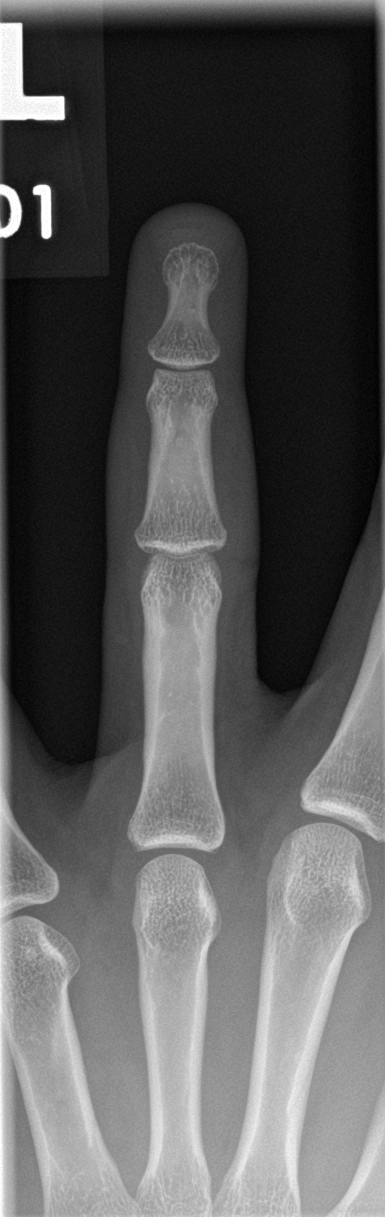

[finger obl]
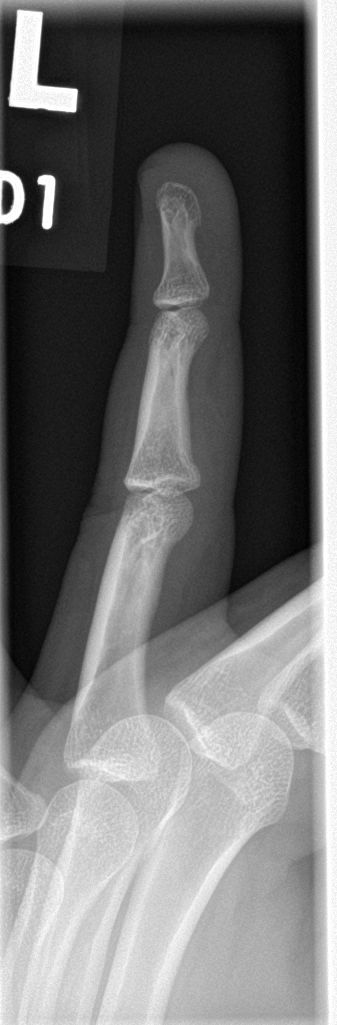

[finger lat]
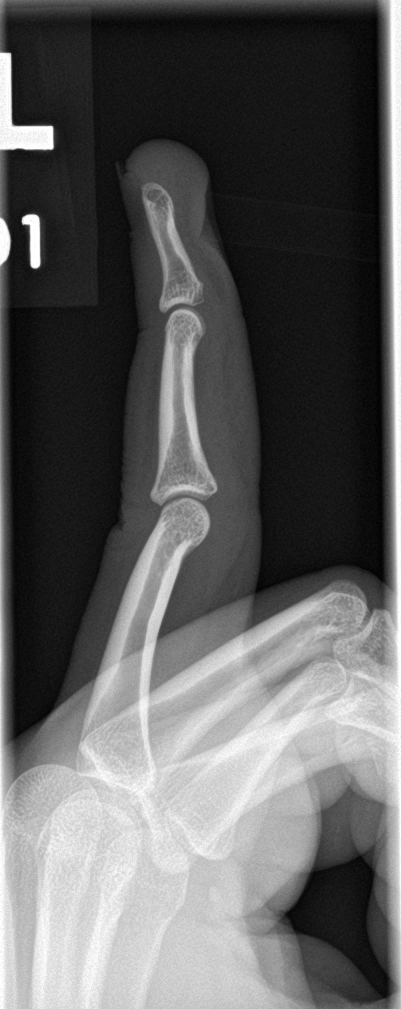

[3 of 3 positions shown; findings below may reference images not displayed]

FINDINGS: There is no acute fracture or dislocation. The bones are well
mineralized. No arthritic changes. Mild soft tissue swelling of the
fourth digit. No radiopaque foreign object or soft tissue gas.
IMPRESSION: No acute fracture or dislocation.

## 2022-06-14 ENCOUNTER — Encounter: Payer: Self-pay | Admitting: *Deleted

## 2022-11-11 ENCOUNTER — Emergency Department (HOSPITAL_BASED_OUTPATIENT_CLINIC_OR_DEPARTMENT_OTHER): Payer: Medicaid Other

## 2022-11-11 ENCOUNTER — Encounter (HOSPITAL_BASED_OUTPATIENT_CLINIC_OR_DEPARTMENT_OTHER): Payer: Self-pay | Admitting: Pediatrics

## 2022-11-11 ENCOUNTER — Emergency Department (HOSPITAL_BASED_OUTPATIENT_CLINIC_OR_DEPARTMENT_OTHER)
Admission: EM | Admit: 2022-11-11 | Discharge: 2022-11-11 | Disposition: A | Discharge status: Home / Self Care | Payer: Medicaid Other | Attending: Emergency Medicine | Admitting: Emergency Medicine

## 2022-11-11 ENCOUNTER — Other Ambulatory Visit: Payer: Self-pay

## 2022-11-11 DIAGNOSIS — H9201 Otalgia, right ear: Secondary | ICD-10-CM | POA: Diagnosis present

## 2022-11-11 DIAGNOSIS — Z20822 Contact with and (suspected) exposure to covid-19: Secondary | ICD-10-CM | POA: Insufficient documentation

## 2022-11-11 LAB — BASIC METABOLIC PANEL
Anion gap: 7 (ref 5–15)
BUN: 8 mg/dL (ref 6–20)
CO2: 25 mmol/L (ref 22–32)
Calcium: 8.9 mg/dL (ref 8.9–10.3)
Chloride: 106 mmol/L (ref 98–111)
Creatinine, Ser: 0.82 mg/dL (ref 0.44–1.00)
GFR, Estimated: >60 mL/min (ref >60)
Glucose, Bld: 89 mg/dL (ref 70–99)
Potassium: 3.9 mmol/L (ref 3.5–5.1)
Sodium: 138 mmol/L (ref 135–145)

## 2022-11-11 LAB — CBC WITH DIFFERENTIAL/PLATELET
Abs Immature Granulocytes: 0.01 10*3/uL (ref 0.00–0.07)
Basophils Absolute: 0 10*3/uL (ref 0.0–0.1)
Basophils Relative: 1 %
Eosinophils Absolute: 0.1 10*3/uL (ref 0.0–0.5)
Eosinophils Relative: 1 %
HCT: 40 % (ref 36.0–46.0)
Hemoglobin: 13.5 g/dL (ref 12.0–15.0)
Immature Granulocytes: 0 %
Lymphocytes Relative: 34 %
Lymphs Abs: 1.9 10*3/uL (ref 0.7–4.0)
MCH: 29.8 pg (ref 26.0–34.0)
MCHC: 33.8 g/dL (ref 30.0–36.0)
MCV: 88.3 fL (ref 80.0–100.0)
Monocytes Absolute: 0.4 10*3/uL (ref 0.1–1.0)
Monocytes Relative: 8 %
Neutro Abs: 3.2 10*3/uL (ref 1.7–7.7)
Neutrophils Relative %: 56 %
Platelets: 253 10*3/uL (ref 150–400)
RBC: 4.53 MIL/uL (ref 3.87–5.11)
RDW: 11.9 % (ref 11.5–15.5)
WBC: 5.7 10*3/uL (ref 4.0–10.5)
nRBC: 0 % (ref 0.0–0.2)

## 2022-11-11 LAB — SARS CORONAVIRUS 2 BY RT PCR: SARS Coronavirus 2 by RT PCR: NEGATIVE

## 2022-11-11 MED ORDER — KETOROLAC TROMETHAMINE 15 MG/ML IJ SOLN
15.0000 mg | Freq: Once | INTRAMUSCULAR | Status: AC
  Administered 2022-11-11: 15 mg via INTRAVENOUS
  Filled 2022-11-11: qty 1

## 2022-11-11 MED ORDER — NAPROXEN 500 MG PO TABS: 500.0000 mg | ORAL_TABLET | Freq: Two times a day (BID) | ORAL | 0 refills | Status: DC

## 2022-11-11 MED ORDER — IOHEXOL 300 MG/ML  SOLN
75.0000 mL | Freq: Once | INTRAMUSCULAR | Status: AC | PRN
  Administered 2022-11-11: 75 mL via INTRAVENOUS

## 2022-11-11 MED ORDER — KETOROLAC TROMETHAMINE 15 MG/ML IJ SOLN: 15.0000 mg | Freq: Once | INTRAMUSCULAR | Status: DC

## 2022-11-11 NOTE — ED Provider Notes (Signed)
Patient care taken over at shift handoff from [facility]. For full HPI, please refer to previous provider's note. Plan is to reassess after CT temporal bone.   Physical Exam  BP 116/79 (BP Location: Right Arm)   Pulse (!) 56   Temp 98.4 F (36.9 C) (Oral)   Resp 16   Ht 5\' 2"  (1.575 m)   Wt 76.2 kg   SpO2 100%   BMI 30.73 kg/m   Physical Exam Vitals and nursing note reviewed.  Constitutional:      Appearance: Normal appearance.  HENT:     Head: Normocephalic and atraumatic.     Ears:     Comments: There is tenderness to the right mastoid with palpation.  Movement of the ear does not reproduce pain.    Mouth/Throat:     Mouth: Mucous membranes are moist.  Eyes:     General: No scleral icterus. Cardiovascular:     Rate and Rhythm: Normal rate and regular rhythm.     Pulses: Normal pulses.     Heart sounds: Normal heart sounds.  Pulmonary:     Effort: Pulmonary effort is normal.     Breath sounds: Normal breath sounds.  Abdominal:     General: Abdomen is flat.     Palpations: Abdomen is soft.     Tenderness: There is no abdominal tenderness.  Musculoskeletal:        General: No deformity.  Skin:    General: Skin is warm.     Findings: No rash.  Neurological:     General: No focal deficit present.     Mental Status: She is alert.  Psychiatric:        Mood and Affect: Mood normal.     Procedures  Procedures  ED Course / MDM   Clinical Course as of 11/11/22 1932  Thu Nov 11, 2022  1710 CBC with Differential Normal.  [CF]  1710 Basic metabolic panel Normal.  [CF]  4098 SARS Coronavirus 2 by RT PCR (hospital order, performed in [facility] hospital lab) *cepheid single result test* Anterior Nasal Swab Negative.  [CF]    Clinical Course User Index [CF] Teressa Lower, PA-C   Medical Decision Making Amount and/or Complexity of Data Reviewed Labs: ordered. Decision-making details documented in ED Course. Radiology: ordered.  Risk Prescription  drug management.   27 year old female presenting with a chief complaint of right ear pain.  There was tenderness over palpation to the right mastoid.  Right ear external canal and TM appear normal.  CT temporal bones showed no evidence of mastoiditis.  Patient was given Toradol for pain.  She reported improvement after this medication.  She does not have any other systemic symptoms and vital signs are normal.  She is stable for discharge.  Advised patient to follow-up with her PCP for reevaluation and management.  Strict ED return precaution discussed.  Disposition Continued outpatient therapy. Follow-up with PCP recommended for reevaluation of symptoms. Treatment plan discussed with patient.  Pt acknowledged understanding was agreeable to the plan. Worrisome signs and symptoms were discussed with patient, and patient acknowledged understanding to return to the ED if they noticed these signs and symptoms. Patient was stable upon discharge.   This chart was dictated using voice recognition software.  Despite best efforts to proofread,  errors can occur which can change the documentation meaning.         Jeanelle Malling, PA 11/12/22 1016    Tanda Rockers A, DO 11/13/22 0000

## 2022-11-11 NOTE — ED Notes (Signed)
Discharge paperwork reviewed entirely with patient, including follow up care. Pain was under control. The patient received instruction and coaching on their prescriptions, and all follow-up questions were answered.  Pt verbalized understanding as well as all parties involved. No questions or concerns voiced at the time of discharge. No acute distress noted.   Pt ambulated out to PVA without incident or assistance.  

## 2022-11-11 NOTE — ED Provider Notes (Signed)
[place] [facility] AT [facility] Provider Note   CSN: [id number] Arrival date & time: 11/11/22  1417     History Chief Complaint  Patient presents with   Otalgia    Lindsey Sherman is a 27 y.o. Lindsey patient who presents to the emergency department today for further evaluation of right ear pain that started this morning.  Patient states she woke up and she started having posterior ear pain just behind the ear on the right.  It is worse with rotational movements of the head primarily looking to the left.  She denies any trouble hearing, drainage from the ear, inner ear pain, fever, chills.   Otalgia      Home Medications Prior to Admission medications   Medication Sig Start Date End Date Taking? Authorizing Provider  benzonatate (TESSALON) 100 MG capsule Take 1 capsule (100 mg total) by mouth 3 (three) times daily as needed for cough. 09/25/20   Waldon Merl, PA-C  fluticasone (FLONASE) 50 MCG/ACT nasal spray Place 2 sprays into both nostrils daily. 01/23/21   Viviano Simas, FNP  predniSONE (STERAPRED UNI-PAK 21 TAB) 10 MG (21) TBPK tablet Use as directed 01/26/21   Jannifer Rodney A, FNP      Allergies    Penicillin g, Penicillins, and Amoxicillin    Review of Systems   Review of Systems  HENT:  Positive for ear pain.   All other systems reviewed and are negative.   Physical Exam Updated Vital Signs BP 131/88 (BP Location: Left Arm)   Pulse 73   Temp 98.4 F (36.9 C) (Oral)   Resp 16   Ht 5\' 2"  (1.575 m)   Wt 76.2 kg   SpO2 100%   BMI 30.73 kg/m  Physical Exam Vitals and nursing note reviewed.  Constitutional:      Appearance: Normal appearance.  HENT:     Head: Normocephalic and atraumatic.     Right Ear: Tympanic membrane, ear canal and external ear normal.     Left Ear: Tympanic membrane, ear canal and external ear normal.     Ears:     Comments: There is tenderness to the right mastoid with palpation.  Movement of the ear does  not reproduce pain. Eyes:     General:        Right eye: No discharge.        Left eye: No discharge.     Conjunctiva/sclera: Conjunctivae normal.  Pulmonary:     Effort: Pulmonary effort is normal.  Musculoskeletal:     Comments: Palpation of the right trapezius muscle does not reproduce pain behind the ear on the right.  Skin:    General: Skin is warm and dry.     Findings: No rash.  Neurological:     General: No focal deficit present.     Mental Status: She is alert.  Psychiatric:        Mood and Affect: Mood normal.        Behavior: Behavior normal.     ED Results / Procedures / Treatments   Labs (all labs ordered are listed, but only abnormal results are displayed) Labs Reviewed - No data to display  EKG None  Radiology No results found.  Procedures Procedures    Medications Ordered in ED Medications  ketorolac (TORADOL) 15 MG/ML injection 15 mg (has no administration in time range)    ED Course/ Medical Decision Making/ A&P Clinical Course as of 11/11/22 1855  Thu Nov 11, 2022  1710 CBC with Differential Normal.  [CF]  1710 Basic metabolic panel Normal.  [CF]  5638 SARS Coronavirus 2 by RT PCR (hospital order, performed in [facility] hospital lab) *cepheid single result test* Anterior Nasal Swab Negative.  [CF]    Clinical Course User Index [CF] Teressa Lower, PA-C   {   Click here for ABCD2, HEART and other calculators  Medical Decision Making Lindsey Sherman is a 27 y.o. Lindsey patient who presents to the emergency department today for valuation of right ear pain.  Considering that there is tenderness to palpation over the right mastoid process does not be somewhat concerned that this could be mastoiditis.  However, I do think this is somewhat unlikely considering that the patient does have a normal in her ear and TM.  She does not have any other systemic symptoms and vital signs are normal.  Nonetheless, shared decision making was done and we will  proceed with a CT scan to further assess with addition of basic labs.  CT scan of the temporal bones are still pending.  I suspect that once this is normal the patient will likely be able to go home.  I have a low suspicion again for mastitis at this time but will wait for the CT scan to result.  Disposition to be made by oncoming provider.   Amount and/or Complexity of Data Reviewed Labs: ordered. Decision-making details documented in ED Course. Radiology: ordered.  Risk Prescription drug management.    Final Clinical Impression(s) / ED Diagnoses Final diagnoses:  None    Rx / DC Orders ED Discharge Orders     None         Teressa Lower, [place] 11/11/22 1855    Sloan Leiter, DO 11/13/22 0000

## 2022-11-11 NOTE — ED Triage Notes (Signed)
C/O posterior ear pain and swelling that she noticed this morning,

## 2022-11-11 NOTE — ED Notes (Signed)
Pt has very young child with her and she is trying to get a family member to come and look after him while she has her CT

## 2022-11-11 NOTE — Discharge Instructions (Signed)
Please take tylenol/ibuprofen/naproxen for pain. I recommend close follow-up with PCP for reevaluation.  Please do not hesitate to return to emergency department if worrisome signs symptoms we discussed become apparent.

## 2023-03-25 ENCOUNTER — Telehealth: Payer: Medicaid Other | Admitting: Family Medicine

## 2023-03-25 NOTE — Progress Notes (Signed)
Pt did not show for visit. DWB

## 2023-04-03 ENCOUNTER — Telehealth: Payer: Medicaid Other | Admitting: Family Medicine

## 2023-04-03 DIAGNOSIS — J029 Acute pharyngitis, unspecified: Secondary | ICD-10-CM | POA: Diagnosis not present

## 2023-04-03 NOTE — Progress Notes (Signed)
E-Visit for Sore Throat  We are sorry that you are not feeling well.  Here is how we plan to help!  Your symptoms indicate a likely viral infection (Pharyngitis).   Pharyngitis is inflammation in the back of the throat which can cause a sore throat, scratchiness and sometimes difficulty swallowing.   Pharyngitis is typically caused by a respiratory virus and will just run its course.  Please keep in mind that your symptoms could last up to 10 days.  For throat pain, we recommend over the counter oral pain relief medications such as acetaminophen or aspirin, or anti-inflammatory medications such as ibuprofen or naproxen sodium.  Topical treatments such as oral throat lozenges or sprays may be used as needed.  Avoid close contact with loved ones, especially the very young and elderly.  Remember to wash your hands thoroughly throughout the day as this is the number one way to prevent the spread of infection and wipe down door knobs and counters with disinfectant.  After careful review of your answers, I would not recommend an antibiotic for your condition.  Antibiotics should not be used to treat conditions that we suspect are caused by viruses like the virus that causes the common cold or flu. However, some people can have Strep with atypical symptoms. You may need formal testing in clinic or office to confirm if your symptoms continue or worsen.  Providers prescribe antibiotics to treat infections caused by bacteria. Antibiotics are very powerful in treating bacterial infections when they are used properly.  To maintain their effectiveness, they should be used only when necessary.  Overuse of antibiotics has resulted in the development of super bugs that are resistant to treatment!    Home Care: Only take medications as instructed by your medical team. Do not drink alcohol while taking these medications. A steam or ultrasonic humidifier can help congestion.  You can place a towel over your head and  breathe in the steam from hot water coming from a faucet. Avoid close contacts especially the very young and the elderly. Cover your mouth when you cough or sneeze. Always remember to wash your hands.  Get Help Right Away If: You develop worsening fever or throat pain. You develop a severe head ache or visual changes. Your symptoms persist after you have completed your treatment plan.  Make sure you Understand these instructions. Will watch your condition. Will get help right away if you are not doing well or get worse.   Thank you for choosing an e-visit.  Your e-visit answers were reviewed by a board certified advanced clinical practitioner to complete your personal care plan. Depending upon the condition, your plan could have included both over the counter or prescription medications.  Please review your pharmacy choice. Make sure the pharmacy is open so you can pick up prescription now. If there is a problem, you may contact your provider through MyChart messaging and have the prescription routed to another pharmacy.  Your safety is important to us. If you have drug allergies check your prescription carefully.   For the next 24 hours you can use MyChart to ask questions about today's visit, request a non-urgent call back, or ask for a work or school excuse. You will get an email in the next two days asking about your experience. I hope that your e-visit has been valuable and will speed your recovery.    have provided 5 minutes of non face to face time during this encounter for chart review and documentation.   

## 2023-04-09 ENCOUNTER — Telehealth: Payer: Medicaid Other | Admitting: Nurse Practitioner

## 2023-04-09 DIAGNOSIS — J069 Acute upper respiratory infection, unspecified: Secondary | ICD-10-CM

## 2023-04-09 MED ORDER — BENZONATATE 100 MG PO CAPS: 100.0000 mg | ORAL_CAPSULE | Freq: Three times a day (TID) | ORAL | 0 refills | Status: DC | PRN

## 2023-04-09 NOTE — Patient Instructions (Signed)
  Lindsey Sherman, thank you for joining Lindsey LELON Servant, NP for today's virtual visit.  While this provider is not your primary care provider (PCP), if your PCP is located in our provider database this encounter information will be shared with them immediately following your visit.   A [place] MyChart account gives you access to today's visit and all your visits, tests, and labs performed at [facility]  click here if you don't have a [place] MyChart account or go to [email or web address]  Consent: (Patient) Lindsey Sherman provided verbal consent for this virtual visit at the beginning of the encounter.  Current Medications:  Current Outpatient Medications:    benzonatate  (TESSALON ) 100 MG capsule, Take 1-2 capsules (100-200 mg total) by mouth 3 (three) times daily as needed for cough., Disp: 30 capsule, Rfl: 0   fluticasone  (FLONASE ) 50 MCG/ACT nasal spray, Place 2 sprays into both nostrils daily., Disp: 16 g, Rfl: 6   naproxen  (NAPROSYN ) 500 MG tablet, Take 1 tablet (500 mg total) by mouth 2 (two) times daily., Disp: 30 tablet, Rfl: 0   predniSONE  (STERAPRED UNI-PAK 21 TAB) 10 MG (21) TBPK tablet, Use as directed, Disp: 21 tablet, Rfl: 0   Medications ordered in this encounter:  Meds ordered this encounter  Medications   benzonatate  (TESSALON ) 100 MG capsule    Sig: Take 1-2 capsules (100-200 mg total) by mouth 3 (three) times daily as needed for cough.    Dispense:  30 capsule    Refill:  0    Supervising Provider:   BLAISE ALEENE Sherman [[id number]]     *If you need refills on other medications prior to your next appointment, please contact your pharmacy*  Follow-Up: Call back or seek an in-person evaluation if the symptoms worsen or if the condition fails to improve as anticipated.  [facility] [phone]  Other Instructions INSTRUCTIONS: use a humidifier for nasal congestion Drink plenty of fluids, rest and wash hands frequently to avoid the  spread of infection Alternate tylenol  and Motrin  for relief of fever    If you have been instructed to have an in-person evaluation today at a local Urgent Care facility, please use the link below. It will take you to a list of all of our available [place] [facility], including address, phone number and hours of operation. Please do not delay care.  [facility]  If you or a family member do not have a primary care provider, use the link below to schedule a visit and establish care. When you choose a [place] primary care physician or advanced practice provider, you gain a long-term partner in health. Find a Primary Care Provider  Learn more about [place]'s in-office and virtual care options:  - Get Care Now
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
## 2023-04-18 ENCOUNTER — Telehealth: Payer: Medicaid Other | Admitting: Physician Assistant

## 2023-04-18 DIAGNOSIS — B354 Tinea corporis: Secondary | ICD-10-CM

## 2023-04-18 MED ORDER — TERBINAFINE HCL 250 MG PO TABS
250.0000 mg | ORAL_TABLET | Freq: Every day | ORAL | 0 refills | Status: DC
Start: 2023-04-18 — End: 2023-05-12

## 2023-04-18 NOTE — Progress Notes (Signed)
E-Visit for Ringworm  We are sorry that you are not feeling well. Here is how we plan to help!  Based on what you shared with me it looks like you have tinea corporis, or "Ringworm".  This type of rash can spread through shared towels, clothing, bedding, etc., as well as hard surfaces (particularly in moist areas) such as shower stalls, locker room floors, pool areas, etc.   Ringwormcan usually be treated with over-the-counter topical antifungal products; but sometimes with chronic or extensive tinea corporis, prescription oral medications are needed.   I am recommending:Clotrimazole 1% cream or gel, apply to area twice per day over the counter   Prescription medications are only indicated for an extensive rash or if over the counter treatments have failed.  I am prescribing:Terbinafine 250 mg once per day for one week  HOME CARE:  Keep feet clean, dry, and cool. Avoid using swimming pools, public showers, or foot baths. Wear sandals when possible or air shoes out by alternating them every 2-3 days. Avoid wearing closed shoes and wearing socks made from fabric that doesn't dry easily (for example, nylon). Treat the infection with recommended medication  GET HELP RIGHT AWAY IF:  Symptoms that don't go away after treatment. Severe itching that persists. If your rash spreads or swells. If your rash begins to have drainage or smell. You develop a fever.  MAKE SURE YOU   Understand these instructions. Will watch your condition. Will get help right away if you are not doing well or get worse.   Thank you for choosing an e-visit.  Your e-visit answers were reviewed by a board certified advanced clinical practitioner to complete your personal care plan. Depending upon the condition, your plan could have included both over the counter or prescription medications.  Please review your pharmacy choice. Make sure the pharmacy is open so you can pick up prescription now. If there is a  problem, you may contact your provider through [employer] and have the prescription routed to another pharmacy.  Your safety is important to [place]. If you have drug allergies check your prescription carefully.   For the next 24 hours you can use MyChart to ask questions about today's visit, request a non-urgent call back, or ask for a work or school excuse.  You will get an email in the next two days asking about your experience. I hope that your e-visit has been valuable and will speed your recovery  References or for more information:  [employer][email or web address]?search=[email or web address]&source=search_result&selectedTitle=1~104&usage_type=default&display_rank=1       I have spent 5 minutes in review of e-visit questionnaire, review and updating patient chart, medical decision making and response to patient.   Margaretann Loveless, PA-C

## 2023-05-12 ENCOUNTER — Encounter (HOSPITAL_BASED_OUTPATIENT_CLINIC_OR_DEPARTMENT_OTHER): Payer: Self-pay

## 2023-05-12 ENCOUNTER — Other Ambulatory Visit: Payer: Self-pay

## 2023-05-12 ENCOUNTER — Emergency Department (HOSPITAL_BASED_OUTPATIENT_CLINIC_OR_DEPARTMENT_OTHER)
Admission: EM | Admit: 2023-05-12 | Discharge: 2023-05-12 | Disposition: A | Discharge status: Home / Self Care | Attending: Emergency Medicine | Admitting: Emergency Medicine

## 2023-05-12 DIAGNOSIS — R197 Diarrhea, unspecified: Secondary | ICD-10-CM | POA: Insufficient documentation

## 2023-05-12 DIAGNOSIS — M791 Myalgia, unspecified site: Secondary | ICD-10-CM | POA: Insufficient documentation

## 2023-05-12 DIAGNOSIS — R109 Unspecified abdominal pain: Secondary | ICD-10-CM | POA: Diagnosis not present

## 2023-05-12 DIAGNOSIS — R112 Nausea with vomiting, unspecified: Secondary | ICD-10-CM | POA: Diagnosis present

## 2023-05-12 LAB — CBC
HCT: 45.6 % (ref 36.0–46.0)
Hemoglobin: 16.2 g/dL — ABNORMAL HIGH (ref 12.0–15.0)
MCH: 30.1 pg (ref 26.0–34.0)
MCHC: 35.5 g/dL (ref 30.0–36.0)
MCV: 84.6 fL (ref 80.0–100.0)
Platelets: 276 10*3/uL (ref 150–400)
RBC: 5.39 MIL/uL — ABNORMAL HIGH (ref 3.87–5.11)
RDW: 12.3 % (ref 11.5–15.5)
WBC: 11.8 10*3/uL — ABNORMAL HIGH (ref 4.0–10.5)
nRBC: 0 % (ref 0.0–0.2)

## 2023-05-12 LAB — COMPREHENSIVE METABOLIC PANEL
ALT: 35 U/L (ref 0–44)
AST: 42 U/L — ABNORMAL HIGH (ref 15–41)
Albumin: 4.8 g/dL (ref 3.5–5.0)
Alkaline Phosphatase: 52 U/L (ref 38–126)
Anion gap: 11 (ref 5–15)
BUN: 18 mg/dL (ref 6–20)
CO2: 22 mmol/L (ref 22–32)
Calcium: 9.5 mg/dL (ref 8.9–10.3)
Chloride: 98 mmol/L (ref 98–111)
Creatinine, Ser: 0.89 mg/dL (ref 0.44–1.00)
GFR, Estimated: >60 mL/min (ref >60)
Glucose, Bld: 109 mg/dL — ABNORMAL HIGH (ref 70–99)
Potassium: 3.4 mmol/L — ABNORMAL LOW (ref 3.5–5.1)
Sodium: 131 mmol/L — ABNORMAL LOW (ref 135–145)
Total Bilirubin: 2.7 mg/dL — ABNORMAL HIGH (ref 0.0–1.2)
Total Protein: 8.7 g/dL — ABNORMAL HIGH (ref 6.5–8.1)

## 2023-05-12 LAB — HCG, SERUM, QUALITATIVE: Preg, Serum: NEGATIVE

## 2023-05-12 LAB — LIPASE, BLOOD: Lipase: 22 U/L (ref 11–51)

## 2023-05-12 MED ORDER — SODIUM CHLORIDE 0.9 % IV BOLUS
1000.0000 mL | Freq: Once | INTRAVENOUS | Status: AC
  Administered 2023-05-12: 1000 mL via INTRAVENOUS

## 2023-05-12 MED ORDER — ONDANSETRON HCL 4 MG/2ML IJ SOLN
4.0000 mg | Freq: Once | INTRAMUSCULAR | Status: AC
  Administered 2023-05-12: 4 mg via INTRAVENOUS
  Filled 2023-05-12: qty 2

## 2023-05-12 MED ORDER — KETOROLAC TROMETHAMINE 15 MG/ML IJ SOLN
15.0000 mg | Freq: Once | INTRAMUSCULAR | Status: AC
  Administered 2023-05-12: 15 mg via INTRAVENOUS
  Filled 2023-05-12: qty 1

## 2023-05-12 MED ORDER — ONDANSETRON 4 MG PO TBDP: 4.0000 mg | ORAL_TABLET | Freq: Three times a day (TID) | ORAL | 0 refills | Status: AC | PRN

## 2023-05-12 MED ORDER — PROCHLORPERAZINE EDISYLATE 10 MG/2ML IJ SOLN
10.0000 mg | Freq: Once | INTRAMUSCULAR | Status: AC
  Administered 2023-05-12: 10 mg via INTRAVENOUS
  Filled 2023-05-12: qty 2
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
## 2023-05-12 NOTE — Discharge Instructions (Signed)

## 2023-05-12 NOTE — ED Triage Notes (Signed)
 N/V/D since yesterday morning. Says a stomach bug has been going through the house.

## 2023-05-13 ENCOUNTER — Emergency Department (HOSPITAL_BASED_OUTPATIENT_CLINIC_OR_DEPARTMENT_OTHER)
Admission: EM | Admit: 2023-05-13 | Discharge: 2023-05-13 | Disposition: A | Discharge status: Home / Self Care | Attending: Emergency Medicine | Admitting: Emergency Medicine

## 2023-05-13 ENCOUNTER — Other Ambulatory Visit: Payer: Self-pay

## 2023-05-13 ENCOUNTER — Emergency Department (HOSPITAL_BASED_OUTPATIENT_CLINIC_OR_DEPARTMENT_OTHER)

## 2023-05-13 DIAGNOSIS — F1210 Cannabis abuse, uncomplicated: Secondary | ICD-10-CM | POA: Diagnosis not present

## 2023-05-13 DIAGNOSIS — R112 Nausea with vomiting, unspecified: Secondary | ICD-10-CM | POA: Insufficient documentation

## 2023-05-13 DIAGNOSIS — R1013 Epigastric pain: Secondary | ICD-10-CM | POA: Diagnosis not present

## 2023-05-13 DIAGNOSIS — E876 Hypokalemia: Secondary | ICD-10-CM | POA: Insufficient documentation

## 2023-05-13 DIAGNOSIS — E871 Hypo-osmolality and hyponatremia: Secondary | ICD-10-CM | POA: Diagnosis not present

## 2023-05-13 LAB — COMPREHENSIVE METABOLIC PANEL
ALT: 36 U/L (ref 0–44)
AST: 40 U/L (ref 15–41)
Albumin: 4.5 g/dL (ref 3.5–5.0)
Alkaline Phosphatase: 49 U/L (ref 38–126)
Anion gap: 16 — ABNORMAL HIGH (ref 5–15)
BUN: 14 mg/dL (ref 6–20)
CO2: 16 mmol/L — ABNORMAL LOW (ref 22–32)
Calcium: 9.4 mg/dL (ref 8.9–10.3)
Chloride: 102 mmol/L (ref 98–111)
Creatinine, Ser: 0.94 mg/dL (ref 0.44–1.00)
GFR, Estimated: >60 mL/min (ref >60)
Glucose, Bld: 93 mg/dL (ref 70–99)
Potassium: 3.3 mmol/L — ABNORMAL LOW (ref 3.5–5.1)
Sodium: 134 mmol/L — ABNORMAL LOW (ref 135–145)
Total Bilirubin: 1.9 mg/dL — ABNORMAL HIGH (ref 0.0–1.2)
Total Protein: 7.7 g/dL (ref 6.5–8.1)

## 2023-05-13 LAB — URINALYSIS, ROUTINE W REFLEX MICROSCOPIC
Glucose, UA: NEGATIVE mg/dL
Ketones, ur: >=80 mg/dL — AB
Leukocytes,Ua: NEGATIVE
Nitrite: NEGATIVE
Protein, ur: 100 mg/dL — AB
Specific Gravity, Urine: 1.025 (ref 1.005–1.030)
pH: 6.5 (ref 5.0–8.0)

## 2023-05-13 LAB — RAPID URINE DRUG SCREEN, HOSP PERFORMED
Amphetamines: NOT DETECTED
Barbiturates: NOT DETECTED
Benzodiazepines: NOT DETECTED
Cocaine: NOT DETECTED
Opiates: NOT DETECTED
Tetrahydrocannabinol: POSITIVE — AB

## 2023-05-13 LAB — CBC
HCT: 43.5 % (ref 36.0–46.0)
Hemoglobin: 15.6 g/dL — ABNORMAL HIGH (ref 12.0–15.0)
MCH: 30 pg (ref 26.0–34.0)
MCHC: 35.9 g/dL (ref 30.0–36.0)
MCV: 83.7 fL (ref 80.0–100.0)
Platelets: 242 10*3/uL (ref 150–400)
RBC: 5.2 MIL/uL — ABNORMAL HIGH (ref 3.87–5.11)
RDW: 12 % (ref 11.5–15.5)
WBC: 7.9 10*3/uL (ref 4.0–10.5)
nRBC: 0 % (ref 0.0–0.2)

## 2023-05-13 LAB — URINALYSIS, MICROSCOPIC (REFLEX)

## 2023-05-13 LAB — LIPASE, BLOOD: Lipase: 36 U/L (ref 11–51)

## 2023-05-13 LAB — PREGNANCY, URINE: Preg Test, Ur: NEGATIVE

## 2023-05-13 MED ORDER — SODIUM CHLORIDE 0.9 % IV BOLUS
2000.0000 mL | Freq: Once | INTRAVENOUS | Status: AC
  Administered 2023-05-13: 2000 mL via INTRAVENOUS

## 2023-05-13 MED ORDER — SUCRALFATE 1 G PO TABS: 1.0000 g | ORAL_TABLET | Freq: Three times a day (TID) | ORAL | 0 refills | Status: AC

## 2023-05-13 MED ORDER — PANTOPRAZOLE SODIUM 40 MG IV SOLR
40.0000 mg | Freq: Once | INTRAVENOUS | Status: AC
  Administered 2023-05-13: 40 mg via INTRAVENOUS
  Filled 2023-05-13: qty 10

## 2023-05-13 MED ORDER — DROPERIDOL 2.5 MG/ML IJ SOLN
1.2500 mg | Freq: Once | INTRAMUSCULAR | Status: AC
  Administered 2023-05-13: 1.25 mg via INTRAVENOUS
  Filled 2023-05-13: qty 2

## 2023-05-13 MED ORDER — PANTOPRAZOLE SODIUM 20 MG PO TBEC: 20.0000 mg | DELAYED_RELEASE_TABLET | Freq: Every day | ORAL | 0 refills | Status: AC

## 2023-05-13 MED ORDER — POTASSIUM CHLORIDE CRYS ER 20 MEQ PO TBCR
40.0000 meq | EXTENDED_RELEASE_TABLET | Freq: Once | ORAL | Status: AC
  Administered 2023-05-13: 40 meq via ORAL
  Filled 2023-05-13 (×2): qty 2

## 2023-05-13 MED ORDER — SODIUM CHLORIDE 0.9 % IV SOLN
12.5000 mg | Freq: Once | INTRAVENOUS | Status: AC
  Administered 2023-05-13: 12.5 mg via INTRAVENOUS
  Filled 2023-05-13: qty 0.5

## 2023-05-13 MED ORDER — PROMETHAZINE HCL 25 MG RE SUPP: 25.0000 mg | Freq: Four times a day (QID) | RECTAL | 0 refills | Status: AC | PRN

## 2023-05-13 MED ORDER — MORPHINE SULFATE (PF) 4 MG/ML IV SOLN
4.0000 mg | Freq: Once | INTRAVENOUS | Status: AC
  Administered 2023-05-13: 4 mg via INTRAVENOUS
  Filled 2023-05-13: qty 1

## 2023-05-13 NOTE — ED Provider Notes (Signed)
 [place] [facility] Provider Note   CSN: [id number] Arrival date & time: 05/13/23  1245     History  Chief Complaint  Patient presents with   Emesis    Lindsey Sherman is a 28 y.o. female.  With history of anxiety presenting to the ED for evaluation of nausea, vomiting, abdominal pain.  Symptoms have been present for 3 days now.  She states her son had similar symptoms but then symptoms improved after approximately 24 hours.  She has not had any diarrhea in the past 24 hours but states that she has had numerous episodes of emesis today.  She states this emesis has been blood-tinged.  No coffee-ground emesis.  She reports upper abdominal pain in a bandlike distribution.  She is wondering if her symptoms are due to her gallbladder as she has a history of gallbladder issues.  She states her suture system has been backing up into her house for the past week.  She denies any fevers.  She denies chest pain or shortness of breath.  No lower abdominal pain, dysuria, frequency, urgency.  No vaginal issues.  No recent travel or hospitalizations.  She was on an antibiotic recently but does not know which one. She vapes nicotine.  No cigarette use.  No marijuana use.  No recreational drug use or alcohol use.   Emesis Associated symptoms: abdominal pain        Home Medications Prior to Admission medications   Medication Sig Start Date End Date Taking? Authorizing Provider  pantoprazole (PROTONIX) 20 MG tablet Take 1 tablet (20 mg total) by mouth daily. 05/13/23  Yes Kirstina Leinweber, Edsel Petrin, PA-C  promethazine (PHENERGAN) 25 MG suppository Place 1 suppository (25 mg total) rectally every 6 (six) hours as needed for nausea or vomiting. 05/13/23  Yes Idalis Hoelting, Edsel Petrin, PA-C  sucralfate (CARAFATE) 1 g tablet Take 1 tablet (1 g total) by mouth 4 (four) times daily -  with meals and at bedtime for 5 days. 05/13/23 05/18/23 Yes Danene Montijo, Edsel Petrin, PA-C  ondansetron  (ZOFRAN-ODT) 4 MG disintegrating tablet Take 1 tablet (4 mg total) by mouth every 8 (eight) hours as needed. 05/12/23   Zadie Rhine, MD      Allergies    Penicillin g, Penicillins, and Amoxicillin    Review of Systems   Review of Systems  Gastrointestinal:  Positive for abdominal pain, nausea and vomiting.  All other systems reviewed and are negative.   Physical Exam Updated Vital Signs BP (!) 140/88   Pulse 69   Temp 98.4 F (36.9 C) (Oral)   Resp 16   Ht 5\' 2"  (1.575 m)   Wt 77.1 kg   SpO2 100%   BMI 31.09 kg/m  Physical Exam Vitals and nursing note reviewed.  Constitutional:      General: She is not in acute distress.    Appearance: Normal appearance. She is normal weight. She is not ill-appearing.     Comments: Comfortably in bed  HENT:     Head: Normocephalic and atraumatic.  Pulmonary:     Effort: Pulmonary effort is normal. No respiratory distress.  Abdominal:     General: Abdomen is flat.  Musculoskeletal:        General: Normal range of motion.     Cervical back: Neck supple.  Skin:    General: Skin is warm and dry.  Neurological:     Mental Status: She is alert and oriented to person, place, and time.  Psychiatric:  Mood and Affect: Mood normal.        Behavior: Behavior normal.     ED Results / Procedures / Treatments   Labs (all labs ordered are listed, but only abnormal results are displayed) Labs Reviewed  COMPREHENSIVE METABOLIC PANEL - Abnormal; Notable for the following components:      Result Value   Sodium 134 (*)    Potassium 3.3 (*)    CO2 16 (*)    Total Bilirubin 1.9 (*)    Anion gap 16 (*)    All other components within normal limits  CBC - Abnormal; Notable for the following components:   RBC 5.20 (*)    Hemoglobin 15.6 (*)    All other components within normal limits  URINALYSIS, ROUTINE W REFLEX MICROSCOPIC - Abnormal; Notable for the following components:   APPearance HAZY (*)    Hgb urine dipstick TRACE (*)     Bilirubin Urine SMALL (*)    Ketones, ur >=80 (*)    Protein, ur 100 (*)    All other components within normal limits  URINALYSIS, MICROSCOPIC (REFLEX) - Abnormal; Notable for the following components:   Bacteria, UA RARE (*)    All other components within normal limits  RAPID URINE DRUG SCREEN, HOSP PERFORMED - Abnormal; Notable for the following components:   Tetrahydrocannabinol POSITIVE (*)    All other components within normal limits  LIPASE, BLOOD  PREGNANCY, URINE    EKG None  Radiology US Abdomen Limited RUQ (LIVER/GB) Result Date: 05/13/2023 CLINICAL DATA:  Epigastric pain for 3-4 days with nausea and vomiting EXAM: ULTRASOUND ABDOMEN LIMITED RIGHT UPPER QUADRANT COMPARISON:  None Available. FINDINGS: Gallbladder: No gallstones or wall thickening visualized. No sonographic Murphy sign noted by sonographer. Common bile duct: Diameter: 5 mm Liver: No focal lesion identified. Within normal limits in parenchymal echogenicity. Portal vein is patent on color Doppler imaging with normal direction of blood flow towards the liver. Other: None. IMPRESSION: No etiology for abdominal pain is identified. Electronically Signed   By: Jacob Moores M.D.   On: 05/13/2023 16:47    Procedures Procedures    Medications Ordered in ED Medications  potassium chloride SA (KLOR-CON M) CR tablet 40 mEq (40 mEq Oral Not Given 05/13/23 1710)  sodium chloride 0.9 % bolus 2,000 mL (0 mLs Intravenous Stopped 05/13/23 1645)  promethazine (PHENERGAN) 12.5 mg in sodium chloride 0.9 % 50 mL IVPB (0 mg Intravenous Stopped 05/13/23 1532)  pantoprazole (PROTONIX) injection 40 mg (40 mg Intravenous Given 05/13/23 1442)  morphine (PF) 4 MG/ML injection 4 mg (4 mg Intravenous Given 05/13/23 1648)  droperidol (INAPSINE) 2.5 MG/ML injection 1.25 mg (1.25 mg Intravenous Given 05/13/23 1730)    ED Course/ Medical Decision Making/ A&P                                 Medical Decision Making Amount and/or Complexity  of Data Reviewed Labs: ordered. Radiology: ordered.  Risk Prescription drug management.  This patient presents to the ED for concern of nausea, vomiting, diarrhea, abdominal pain, this involves an extensive number of treatment options, and is a complaint that carries with it a high risk of complications and morbidity.  The emergent differential diagnosis for vomiting includes, but is not limited to ACS/MI, DKA, Ischemic bowel, Meningitis, Sepsis, Acute gastric dilation, Adrenal insufficiency, Appendicitis,  Bowel obstruction/ileus, Carbon monoxide poisoning, Cholecystitis, Electrolyte abnormalities, Elevated ICP, Gastric outlet obstruction, Pancreatitis, Ruptured viscus, Biliary  colic, Cannabinoid hyperemesis syndrome, Gastritis, Gastroenteritis, Gastroparesis,  Narcotic withdrawal, Peptic ulcer disease, and UTI   My initial workup includes   Additional history obtained from: Nursing notes from this visit. Previous records within EMR system ED visit for same yesterday  I ordered, reviewed and interpreted labs which include: CBC, CMP, lipase, urinalysis, urine pregnancy, UDS.  Hyponatremia and hypokalemia, bicarb of 16 with an anion gap of 16, total bilirubin 1.9.  No leukocytosis.  Hemoglobin 15.6.  Urine without evidence of infection but does have greater than 80 ketones and 100 protein.  Lipase normal.  Urine pregnancy negative.  I ordered imaging studies including right upper quadrant ultrasound I independently visualized and interpreted imaging which showed no acute abnormality I agree with the radiologist interpretation  Afebrile, hemodynamically stable.  28 year old female presenting to the ED for evaluation of nausea, vomiting, epigastric abdominal pain.  Symptoms began a couple days ago.  She presented to the ER yesterday for similar symptoms.  She states that her diarrhea has stopped, however she is still vomiting quite a bit.  She now has a burning sensation to her stomach and  esophagus.  She is wondering if this is due to her gallbladder as she has had issues with this in the past.  She initially tells me that she does not use any recreational drugs, however UDS is positive for THC.  She was given Phenergan initially and stated some improvement in his symptoms, however vomited after attempting to replete her potassium.  She was then given droperidol and was able to tolerate oral intake without difficulty.  She denies any lower abdominal pain.  Lab consistent with dehydration and starvation ketosis.  She was rehydrated in the emergency department.  I suspect she has some gastritis as well as gastroenteritis.  She will be started on Protonix and Carafate for this.  She was encouraged to stop smoking.  She was sent a prescription for rectal Phenergan as well.  She was given return precautions.  Stable at discharge.  At this time there does not appear to be any evidence of an acute emergency medical condition and the patient appears stable for discharge with appropriate outpatient follow up. Diagnosis was discussed with patient who verbalizes understanding of care plan and is agreeable to discharge. I have discussed return precautions with patient who verbalizes understanding. Patient encouraged to follow-up with their PCP within 1 week. All questions answered.  Note: Portions of this report may have been transcribed using voice recognition software. Every effort was made to ensure accuracy; however, inadvertent computerized transcription errors may still be present.        Final Clinical Impression(s) / ED Diagnoses Final diagnoses:  Nausea and vomiting, unspecified vomiting type  Epigastric abdominal pain  Marijuana abuse    Rx / DC Orders ED Discharge Orders          Ordered    promethazine (PHENERGAN) 25 MG suppository  Every 6 hours PRN        05/13/23 1702    pantoprazole (PROTONIX) 20 MG tablet  Daily        05/13/23 1702    sucralfate (CARAFATE) 1 g tablet   3 times daily with meals & bedtime        05/13/23 1702              Michelle Piper, PA-C 05/13/23 1746    Rexford Maus, DO 05/16/23 [phone]

## 2023-05-13 NOTE — ED Triage Notes (Signed)
 Pt arrives via [facility] c/o N/V. States that she was seen yesterday for the same thing and now has bloody tinged emesis.

## 2023-05-13 NOTE — Discharge Instructions (Signed)
 You have been seen today for your complaint of nausea, vomiting, upper abdominal pain. Your lab work showed dehydration. Your imaging was reassuring. Your discharge medications include Phenergan.  This is a nausea medicine.  Do not use this and Zofran at the same time, pick 1 to use. Protonix.  This is a medicine used to help with your stomach pain.  This is a daily medicine, you should use this every day for the next month. Carafate.  This is a medicine used to help heal your stomach.  Use this as prescribed and for the entire duration of the prescription. Home care instructions are as follows:  Decrease vaping.  Stop using marijuana.  Drink plenty of water, eat and easy to digest diet such as bananas, rice, apples and toast Follow up with: Your primary care provider Please seek immediate medical care if you develop any of the following symptoms: You vomit blood or something that looks like coffee grounds. You have black or dark red poop. You throw up any time you try to drink fluids. At this time there does not appear to be the presence of an emergent medical condition, however there is always the potential for conditions to change. Please read and follow the below instructions.  Do not take your medicine if  develop an itchy rash, swelling in your mouth or lips, or difficulty breathing; call 911 and seek immediate emergency medical attention if this occurs.  You may review your lab tests and imaging results in their entirety on your MyChart account.  Please discuss all results of fully with your primary care provider and other specialist at your follow-up visit.  Note: Portions of this text may have been transcribed using voice recognition software. Every effort was made to ensure accuracy; however, inadvertent computerized transcription errors may still be present.
